# Patient Record
Sex: Male | Born: 2000 | Race: Black or African American | Hispanic: No | Marital: Single | Smoking: Current some day smoker
Health system: Southern US, Community
[De-identification: ages and names within clinical notes are randomized; demographics above are authoritative.]

## PROBLEM LIST (undated history)

## (undated) HISTORY — PX: WISDOM TOOTH EXTRACTION: SHX21

---
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
## 2020-01-25 ENCOUNTER — Ambulatory Visit (INDEPENDENT_AMBULATORY_CARE_PROVIDER_SITE_OTHER): Payer: Medicaid Other

## 2020-01-25 ENCOUNTER — Ambulatory Visit (HOSPITAL_COMMUNITY)
Admission: EM | Admit: 2020-01-25 | Discharge: 2020-01-25 | Disposition: A | Discharge status: Home / Self Care | Payer: Medicaid Other | Attending: Family Medicine | Admitting: Family Medicine

## 2020-01-25 ENCOUNTER — Other Ambulatory Visit: Payer: Self-pay

## 2020-01-25 ENCOUNTER — Encounter (HOSPITAL_COMMUNITY): Payer: Self-pay

## 2020-01-25 DIAGNOSIS — M79671 Pain in right foot: Secondary | ICD-10-CM | POA: Diagnosis not present

## 2020-01-25 MED ORDER — CEPHALEXIN 500 MG PO CAPS: 500.0000 mg | ORAL_CAPSULE | Freq: Two times a day (BID) | ORAL | 0 refills | Status: DC

## 2020-01-25 NOTE — Discharge Instructions (Addendum)
Please try ice  Please try to elevate  Please try the medicine  Please avoid walking on the foot  Please follow up with sports medicine in two days.

## 2020-01-25 NOTE — ED Provider Notes (Signed)
[facility]    CSN: [id number] Arrival date & time: 01/25/20  1805      History   Chief Complaint Chief Complaint  Patient presents with  . Foot Pain    HPI Caleb Decock is a 19 y.o. male.   He is presenting with right foot pain.  He is having swelling and pain over the dorsum and lateral aspect of his forefoot.  He had an inversion injury on Saturday while playing basketball.  He is unable to walk or ambulate without significant pain.  He is able to move his toes.  No history of similar pain or injury.  HPI  History reviewed. No pertinent past medical history.  There are no problems to display for this patient.   History reviewed. No pertinent surgical history.     Home Medications    Prior to Admission medications   Medication Sig Start Date End Date Taking? Authorizing Provider  cephALEXin (KEFLEX) 500 MG capsule Take 1 capsule (500 mg total) by mouth 2 (two) times daily. 01/25/20   Myra Rude, MD  lisdexamfetamine (VYVANSE) 20 MG capsule Take 20 mg by mouth every morning.  12/26/19  [provider]    Family History Family History  Problem Relation Age of Onset  . Healthy Mother   . Healthy Father     Social History Social History   Tobacco Use  . Smoking status: Current Some Day Smoker    Types: Cigarettes  . Smokeless tobacco: Never Used  Substance Use Topics  . Alcohol use: Never  . Drug use: Yes    Types: Marijuana     Allergies   Patient has no known allergies.   Review of Systems Review of Systems  See HPI  Physical Exam Triage Vital Signs ED Triage Vitals [01/25/20 1859]  Enc Vitals Group     BP 131/82     Pulse Rate 60     Resp 18     Temp 98.9 F (37.2 C)     Temp Source Oral     SpO2 95 %     Weight      Height      Head Circumference      Peak Flow      Pain Score 6     Pain Loc      Pain Edu?      Excl. in GC?    No data found.  Updated Vital Signs BP 131/82 (BP Location: Right Arm)    Pulse 60   Temp 98.9 F (37.2 C) (Oral)   Resp 18   SpO2 95%   Visual Acuity Right Eye Distance:   Left Eye Distance:   Bilateral Distance:    Right Eye Near:   Left Eye Near:    Bilateral Near:     Physical Exam Gen: NAD, alert, cooperative with exam, well-appearing ENT: normal lips, normal nasal mucosa,  Neuro: normal tone, normal sensation to touch Psych:  normal insight, alert and oriented MSK:  Right foot: Significant swelling over the dorsum midfoot and forefoot. Tenderness to palpation over the metatarsals as well as the tarsometatarsal joints of the lateral midfoot forefoot. Erythema and warmth in this area. Neurovascular intact   UC Treatments / Results  Labs (all labs ordered are listed, but only abnormal results are displayed) Labs Reviewed - No data to display  EKG   Radiology DG Foot Complete Right  Result Date: 01/25/2020 CLINICAL DATA:  Acute right foot pain after injury 2  days ago. EXAM: RIGHT FOOT COMPLETE - 3+ VIEW COMPARISON:  None. FINDINGS: There is no evidence of fracture or dislocation. There is no evidence of arthropathy or other focal bone abnormality. Soft tissues are unremarkable. IMPRESSION: Negative. Electronically Signed   By: Marijo Conception M.D.   On: 01/25/2020 20:06    Procedures Procedures (including critical care time)  Medications Ordered in UC Medications - No data to display  Initial Impression / Assessment and Plan / UC Course  I have reviewed the triage vital signs and the nursing notes.  Pertinent labs & imaging results that were available during my care of the patient were reviewed by me and considered in my medical decision making (see chart for details).     Mr. Orvis is a 19 year old male that is presenting with right foot pain.  Imaging was unrevealing for fracture.  Possible for ligamentous injury but has erythema and warmth over the foot.  Concern for infection so will provide Keflex.  Provided crutches and  counseled on nonweightbearing.  Counseled on close follow-up as to monitor symptoms and need for possible further imaging or work-up.  Final Clinical Impressions(s) / UC Diagnoses   Final diagnoses:  Foot pain, right     Discharge Instructions     Please try ice  Please try to elevate  Please try the medicine  Please avoid walking on the foot  Please follow up with sports medicine in two days.     ED Prescriptions    Medication Sig Dispense Auth. Provider   cephALEXin (KEFLEX) 500 MG capsule Take 1 capsule (500 mg total) by mouth 2 (two) times daily. 14 capsule Rosemarie Ax, MD     PDMP not reviewed this encounter.   Rosemarie Ax, MD 01/25/20 2117

## 2020-01-25 NOTE — ED Triage Notes (Signed)
Pt presents with right foot pain from injury while playing ball on Saturday.

## 2021-01-29 ENCOUNTER — Encounter (HOSPITAL_COMMUNITY): Payer: Self-pay | Admitting: Emergency Medicine

## 2021-01-29 ENCOUNTER — Emergency Department (HOSPITAL_COMMUNITY): Payer: Medicaid Other

## 2021-01-29 ENCOUNTER — Other Ambulatory Visit: Payer: Self-pay

## 2021-01-29 ENCOUNTER — Emergency Department (HOSPITAL_COMMUNITY)
Admission: EM | Admit: 2021-01-29 | Discharge: 2021-01-29 | Disposition: A | Discharge status: Home / Self Care | Payer: Medicaid Other | Attending: Emergency Medicine | Admitting: Emergency Medicine

## 2021-01-29 DIAGNOSIS — M25562 Pain in left knee: Secondary | ICD-10-CM | POA: Insufficient documentation

## 2021-01-29 DIAGNOSIS — X58XXXA Exposure to other specified factors, initial encounter: Secondary | ICD-10-CM | POA: Insufficient documentation

## 2021-01-29 DIAGNOSIS — Y9367 Activity, basketball: Secondary | ICD-10-CM | POA: Diagnosis not present

## 2021-01-29 DIAGNOSIS — F17210 Nicotine dependence, cigarettes, uncomplicated: Secondary | ICD-10-CM | POA: Insufficient documentation

## 2021-01-29 MED ORDER — HYDROCODONE-ACETAMINOPHEN 5-325 MG PO TABS
1.0000 | ORAL_TABLET | Freq: Once | ORAL | Status: AC
  Administered 2021-01-29: 1 via ORAL
  Filled 2021-01-29: qty 1

## 2021-01-29 MED ORDER — HYDROCODONE-ACETAMINOPHEN 5-325 MG PO TABS: 1.0000 | ORAL_TABLET | ORAL | 0 refills | Status: DC | PRN

## 2021-01-29 NOTE — Progress Notes (Signed)
Orthopedic Tech Progress Note Patient Details:  Nathaniel Matthews [birth year] [id number]  Ortho Devices Type of Ortho Device: Knee Immobilizer Ortho Device/Splint Location: Left Lower Extremity Ortho Device/Splint Interventions: Ordered,Application,Adjustment   Post Interventions Patient Tolerated: Well Instructions Provided: Adjustment of device,Care of device,Poper ambulation with device   [name] P Harle Stanford 01/29/2021, 1:36 PM

## 2021-01-29 NOTE — Discharge Instructions (Signed)
Based on your history and exam I am concerned he may have torn some of the ligaments in your knee or injured the soft tissues around your knee.  There is no evidence of bony injury seen on the x-ray today.  Please use the knee immobilizer and your crutches and use some of the pain medicine to help with symptoms.  Please follow-up with an orthopedist for likely outpatient imaging.  If any symptoms change or worsen, please return to the nearest emergency department.

## 2021-01-29 NOTE — ED Triage Notes (Signed)
C/o L knee pain.  States he injured it playing basketball yesterday.  Pt wearing knee brace on arrival.

## 2021-01-29 NOTE — ED Notes (Signed)
Ice bag placed

## 2021-01-29 NOTE — ED Provider Notes (Signed)
[facility] EMERGENCY DEPARTMENT Provider Note   CSN: [id number] Arrival date & time: 01/29/21  1136     History Chief Complaint  Patient presents with  . Knee Pain    Nathaniel Matthews is a 20 y.o. male.  The history is provided by the patient and medical records. No language interpreter was used.  Knee Pain Location:  Knee Time since incident:  1 day Injury: yes   Mechanism of injury comment:  Pivot playing basketball Knee location:  L knee Pain details:    Quality:  Sharp   Radiates to:  Does not radiate   Severity:  Severe   Onset quality:  Sudden   Timing:  Constant   Progression:  Unchanged Chronicity:  New Prior injury to area:  No Relieved by:  Nothing Worsened by:  Bearing weight Ineffective treatments:  None tried Associated symptoms: swelling   Associated symptoms: no back pain, no fatigue, no fever, no neck pain, no numbness, no stiffness and no tingling        History reviewed. No pertinent past medical history.  There are no problems to display for this patient.   History reviewed. No pertinent surgical history.     Family History  Problem Relation Age of Onset  . Healthy Mother   . Healthy Father     Social History   Tobacco Use  . Smoking status: Current Some Day Smoker    Types: Cigarettes  . Smokeless tobacco: Never Used  Vaping Use  . Vaping Use: Never used  Substance Use Topics  . Alcohol use: Never  . Drug use: Yes    Types: Marijuana    Home Medications Prior to Admission medications   Medication Sig Start Date End Date Taking? Authorizing Provider  cephALEXin (KEFLEX) 500 MG capsule Take 1 capsule (500 mg total) by mouth 2 (two) times daily. 01/25/20   Myra Rude, MD  lisdexamfetamine (VYVANSE) 20 MG capsule Take 20 mg by mouth every morning.  12/26/19  [provider]    Allergies    Patient has no known allergies.  Review of Systems   Review of Systems  Constitutional: Negative for  chills, fatigue and fever.  HENT: Negative for congestion.   Eyes: Negative for visual disturbance.  Respiratory: Negative for cough, chest tightness, shortness of breath and wheezing.   Cardiovascular: Negative for chest pain.  Gastrointestinal: Negative for constipation and nausea.  Genitourinary: Negative for dysuria and flank pain.  Musculoskeletal: Negative for back pain, neck pain and stiffness.  Neurological: Negative for dizziness, light-headedness and headaches.  All other systems reviewed and are negative.   Physical Exam Updated Vital Signs BP 119/73 (BP Location: Right Arm)   Pulse (!) 110   Temp 98.2 F (36.8 C) (Oral)   Resp 18   SpO2 94%   Physical Exam Vitals and nursing note reviewed.  Constitutional:      Appearance: He is well-developed.  HENT:     Head: Normocephalic and atraumatic.  Eyes:     Conjunctiva/sclera: Conjunctivae normal.  Cardiovascular:     Rate and Rhythm: Normal rate and regular rhythm.     Heart sounds: No murmur heard.   Pulmonary:     Effort: Pulmonary effort is normal. No respiratory distress.     Breath sounds: Normal breath sounds.  Abdominal:     Palpations: Abdomen is soft.     Tenderness: There is no abdominal tenderness.  Musculoskeletal:        General: Swelling  and tenderness present.     Cervical back: Neck supple.     Left knee: Tenderness present.     Right lower leg: No edema.     Left lower leg: No edema.       Legs:     Comments: Intact, strength, sensation, and pulses distally to the injury.  Some swelling and tenderness of the left knee.  Skin:    General: Skin is warm and dry.     Findings: No erythema.  Neurological:     General: No focal deficit present.     Mental Status: He is alert.     Sensory: No sensory deficit.     Motor: No weakness.     ED Results / Procedures / Treatments   Labs (all labs ordered are listed, but only abnormal results are displayed) Labs Reviewed - No data to  display  EKG None  Radiology DG Knee Complete 4 Views Left  Result Date: 01/29/2021 CLINICAL DATA:  20 year old male with history of generalized left knee pain. EXAM: LEFT KNEE - COMPLETE 4+ VIEW COMPARISON:  No priors. FINDINGS: No evidence of fracture, dislocation, or joint effusion. No evidence of arthropathy or other focal bone abnormality. Soft tissues are unremarkable. IMPRESSION: Negative. Electronically Signed   By: Trudie Reed M.D.   On: 01/29/2021 12:37    Procedures Procedures   Medications Ordered in ED Medications  HYDROcodone-acetaminophen (NORCO/VICODIN) 5-325 MG per tablet 1 tablet (1 tablet Oral Given 01/29/21 1325)    ED Course  I have reviewed the triage vital signs and the nursing notes.  Pertinent labs & imaging results that were available during my care of the patient were reviewed by me and considered in my medical decision making (see chart for details).    MDM Rules/Calculators/A&P                          [name] is a 20 y.o. male with no significant past medical history presents with left knee injury.  He reports that yesterday he was pain basketball when he pivoted and felt sudden onset of pain.  He reports it has been swollen and hurting and he has not been able to walk on it very well.  He is using crutches and a knee immobilizer but was told him to come in to look for fracture.  He denied falling on it or getting struck during his basketball play.  He denies any numbness, tingling, or weakness otherwise at discharge to stand or walk on it or bend.  Denies any other complaints including no preceding symptom such as fevers, chills, nausea, vomiting, or other complaints.  On exam, patient does have tenderness and swelling of the left knee but did not have any tenderness in the ankle or hip.  It is slightly swollen and tender but there was no evidence of erythema.  No crepitance.  He could range his knee with pain.  Intact pulses distally.  Intact  sensation and strength distal to the injury.  Clinically I am concerned the patient has either a ligamentous injury or meniscus injury primarily.  X-rays were obtained showing no evidence of fracture or dislocation.  Patient was given a more appropriately sized knee immobilizer and will use his home crutches.  He will follow-up with outpatient orthopedics for likely more advanced imaging to look for ligamentous injury.  Patient given prescription for pain medicine and was given a dose here due to the amount of  discomfort he is having.  Given lack of other injuries, we feel he is safe for discharge home.  Patient was given return precautions of all instructions and was discharged in good condition.             Final Clinical Impression(s) / ED Diagnoses Final diagnoses:  Acute pain of left knee  Injury while playing basketball    Rx / DC Orders ED Discharge Orders         Ordered    HYDROcodone-acetaminophen (NORCO/VICODIN) 5-325 MG tablet  Every 4 hours PRN        01/29/21 1337          Clinical Impression: 1. Acute pain of left knee   2. Injury while playing basketball     Disposition: Discharge  Condition: Good  I have discussed the results, Dx and Tx plan with the pt(& family if present). He/she/they expressed understanding and agree(s) with the plan. Discharge instructions discussed at great length. Strict return precautions discussed and pt &/or family have verbalized understanding of the instructions. No further questions at time of discharge.    New Prescriptions   HYDROCODONE-ACETAMINOPHEN (NORCO/VICODIN) 5-325 MG TABLET    Take 1 tablet by mouth every 4 (four) hours as needed.    Follow Up: Roby Lofts, MD [address] [place] [address] [phone]   with orthopedics  [facility] EMERGENCY DEPARTMENT [address] [id number] mc [place] [address] [phone]       Shakti Fleer, Canary Brim,  MD 01/29/21 1348

## 2021-02-01 ENCOUNTER — Other Ambulatory Visit: Payer: Self-pay

## 2021-02-01 ENCOUNTER — Ambulatory Visit (INDEPENDENT_AMBULATORY_CARE_PROVIDER_SITE_OTHER): Payer: Medicaid Other | Admitting: Family

## 2021-02-01 ENCOUNTER — Encounter: Payer: Self-pay | Admitting: Family

## 2021-02-01 DIAGNOSIS — S8992XA Unspecified injury of left lower leg, initial encounter: Secondary | ICD-10-CM

## 2021-02-01 DIAGNOSIS — M25362 Other instability, left knee: Secondary | ICD-10-CM | POA: Diagnosis not present

## 2021-02-01 NOTE — Progress Notes (Signed)
Office Visit Note   Patient: Nathaniel Matthews           Date of Birth: [birth year]           MRN: [id number] Visit Date: 02/01/2021              Requested by: Bernadette Hoit, MD [facility] [address] [place],  [place] [address] PCP: Bernadette Hoit, MD  No chief complaint on file.     HPI: The patient is a 20 year old male seen today for initial evaluation of left knee pain after a pivot/twist injury playing basketball.  Complaining of pain global swelling instability of the knee unable to weight-bear due to pain and buckling of the knee.  He has been seen by urgent care as well as an emergency department for the same.  He is currently using crutches and a knee brace for nonweightbearing.  Assessment & Plan: Visit Diagnoses:  1. Knee instability, left   2. Left knee injury, initial encounter     Plan: plan to proceed with MRI, concern for ligamentous injury. Continue crutches for support. Will follow up for mri review with dean.   Follow-Up Instructions: Return mri review with dean.   Left Knee Exam   Tenderness  The patient is experiencing tenderness in the LCL and MCL.  Range of Motion  The patient has normal left knee ROM.  Other  Effusion: effusion present  Comments:  Medial and lateral laxity with firm endpoint  Difficulty with drawer testing due to pain, patient resists      Patient is alert, oriented, no adenopathy, well-dressed, normal affect, normal respiratory effort.   Imaging: No results found. No images are attached to the encounter.  Labs: Lab Results  Component Value Date   REPTSTATUS 11/24/2019 FINAL 11/22/2019   CULT  11/22/2019    NO GROUP A STREP (S.PYOGENES) ISOLATED Performed at [facility], [address], [place], [place] [address]      No results found for: ALBUMIN, PREALBUMIN, CBC  No results found for: MG No results found for: VD25OH  No results found for: PREALBUMIN No  flowsheet data found.   There is no height or weight on file to calculate BMI.  Orders:  Orders Placed This Encounter  Procedures  . MR Knee Left w/o contrast   Meds ordered this encounter  Medications  . HYDROcodone-acetaminophen (NORCO) 5-325 MG tablet    Sig: Take 1 tablet by mouth every 6 (six) hours as needed.    Dispense:  30 tablet    Refill:  0     Procedures: No procedures performed  Clinical Data: No additional findings.  ROS:  All other systems negative, except as noted in the HPI. Review of Systems  Constitutional: Negative for chills and fever.  Musculoskeletal: Positive for arthralgias, gait problem, joint swelling and myalgias.  Neurological: Negative for weakness.    Objective: Vital Signs: There were no vitals taken for this visit.  Specialty Comments:  No specialty comments available.  PMFS History: There are no problems to display for this patient.  History reviewed. No pertinent past medical history.  Family History  Problem Relation Age of Onset  . Healthy Mother   . Healthy Father     History reviewed. No pertinent surgical history. Social History   Occupational History  . Not on file  Tobacco Use  . Smoking status: Current Some Day Smoker    Types: Cigarettes  . Smokeless tobacco: Never Used  Vaping Use  . Vaping Use: Never used  Substance and Sexual Activity  . Alcohol use: Never  . Drug use: Yes    Types: Marijuana  . Sexual activity: Not Currently

## 2021-02-02 ENCOUNTER — Telehealth: Payer: Self-pay | Admitting: Family

## 2021-02-02 NOTE — Telephone Encounter (Signed)
Mom called. Says the pain medication was not sent in for the patient. Would like medication called in for him. Her call back number is [phone]

## 2021-02-02 NOTE — Telephone Encounter (Signed)
This pt was in the office Wednesday did you say you were going to call in rx for pain?

## 2021-02-03 MED ORDER — HYDROCODONE-ACETAMINOPHEN 5-325 MG PO TABS: 1.0000 | ORAL_TABLET | Freq: Four times a day (QID) | ORAL | 0 refills | Status: DC | PRN

## 2021-02-10 ENCOUNTER — Telehealth: Payer: Self-pay | Admitting: Orthopedic Surgery

## 2021-02-10 NOTE — Telephone Encounter (Signed)
Appt made. MRI is scheduled for Sat.

## 2021-02-10 NOTE — Telephone Encounter (Signed)
Pt mother called and wants to know if Dr.Dean can work pt in this week or if Franky Macho could possibly see him because he is in a lot of pain   CB [phone]

## 2021-02-11 ENCOUNTER — Other Ambulatory Visit: Payer: Self-pay

## 2021-02-11 ENCOUNTER — Ambulatory Visit
Admission: RE | Admit: 2021-02-11 | Discharge: 2021-02-11 | Disposition: A | Discharge status: Home / Self Care | Payer: Medicaid Other | Source: Ambulatory Visit | Attending: Family | Admitting: Family

## 2021-02-11 DIAGNOSIS — M25362 Other instability, left knee: Secondary | ICD-10-CM

## 2021-02-13 ENCOUNTER — Ambulatory Visit (INDEPENDENT_AMBULATORY_CARE_PROVIDER_SITE_OTHER): Payer: Medicaid Other | Admitting: Orthopedic Surgery

## 2021-02-13 ENCOUNTER — Encounter: Payer: Self-pay | Admitting: Orthopedic Surgery

## 2021-02-13 ENCOUNTER — Other Ambulatory Visit: Payer: Self-pay

## 2021-02-13 ENCOUNTER — Ambulatory Visit (HOSPITAL_COMMUNITY)
Admission: RE | Admit: 2021-02-13 | Discharge: 2021-02-13 | Disposition: A | Discharge status: Home / Self Care | Payer: Medicaid Other | Source: Ambulatory Visit | Attending: Orthopedic Surgery | Admitting: Orthopedic Surgery

## 2021-02-13 DIAGNOSIS — M25362 Other instability, left knee: Secondary | ICD-10-CM

## 2021-02-15 ENCOUNTER — Other Ambulatory Visit: Payer: Self-pay

## 2021-02-16 ENCOUNTER — Encounter: Payer: Self-pay | Admitting: Orthopedic Surgery

## 2021-02-16 NOTE — Progress Notes (Signed)
Office Visit Note   Patient: Nathaniel Matthews           Date of Birth: 2000-09-06           MRN: [id number] Visit Date: 02/13/2021 Requested by: Bernadette Hoit, MD [facility] [address] [place],  [place] [address] PCP: Bernadette Hoit, MD  Subjective: Chief Complaint  Patient presents with   Left Knee - Pain    HPI: Nathaniel Matthews is a 20 year old patient who injured his left knee playing basketball on May 28.  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  He has been on crutches.  He has improved some over the past several days.  MRI scan is reviewed.  ACL is torn.  There is medial meniscal tear with horizontal and possible meniscocapsular injury.  There is also significant lateral meniscal pathology with avulsed meniscal tissue flipped anteriorly.  Very little if any meniscal tissue is present in the posterior aspect of the knee on the lateral side.  There is also proximal avulsion of the MCL.  No definite personal history of DVT but the patient is adopted so that part of his history is unknown.              ROS: All systems reviewed are negative as they relate to the chief complaint within the history of present illness.  Patient denies  fevers or chills.   Assessment & Plan: Visit Diagnoses:  1. Knee instability, left     Plan: Impression is significant multi ligament knee injury with meniscal pathology on both sides.  Pedal pulses palpable with ankle dorsiflexion intact.  Does have mild calf swelling and tenderness today but ultrasound performed by the time of this dictation was negative for DVT.  Plan is to continue with range of motion exercises and limited weightbearing with crutches.  Needs to try to achieve full extension prior to surgery but that may be difficult with that flipped meniscal tissue present.  Acute inflammatory phase has passed in the 2 weeks since injury.  Plan ACL reconstruction with quad autograft and meniscal repair  medially and laterally.  We will assess the MCL at the time of surgery.  Is possible that based on the amount of laxity present we may not need to fix the MCL.  The risk and benefits of the procedure are discussed with the patient including not limited to infection nerve vessel damage knee stiffness as well as the prolonged rehabilitation required with this injury.  Particularly with that lateral meniscal pathology present.  If the lateral meniscus is repaired it would likely require a meniscal root type repair.  Patient understands the risk and benefits and wishes to proceed.  All questions answered.  Follow-Up Instructions: No follow-ups on file.   Orders:  Orders Placed This Encounter  Procedures   VAS [place] LOWER EXTREMITY VENOUS (DVT)   No orders of the defined types were placed in this encounter.     Procedures: No procedures performed   Clinical Data: No additional findings.  Objective: Vital Signs: There were no vitals taken for this visit.  Physical Exam:   Constitutional: Patient appears well-developed HEENT:  Head: Normocephalic Eyes:EOM are normal Neck: Normal range of motion Cardiovascular: Normal rate Pulmonary/chest: Effort normal Neurologic: Patient is alert Skin: Skin is warm Psychiatric: Patient has normal mood and affect   Ortho Exam: Ortho exam demonstrates range of motion of just below 10 degrees full extension to 115 of flexion.  Does have  some MCL laxity to valgus stress at both 0 and 30 degrees.  Opens up about 2 mm more at 0 degrees compared to the right and about 4 mm more at 30 degrees compared to the right-hand side with valgus stress.  No posterior rotatory instability is present.  ACL laxity is present.  Extensor mechanism is intact.  Pedal pulses palpable.  Ankle dorsiflexion intact.  Mild calf tenderness to direct palpation and with Homans' sign.  Specialty Comments:  No specialty comments available.  Imaging: No results found.   PMFS  History: There are no problems to display for this patient.  History reviewed. No pertinent past medical history.  Family History  Problem Relation Age of Onset   Healthy Mother    Healthy Father     History reviewed. No pertinent surgical history. Social History   Occupational History   Not on file  Tobacco Use   Smoking status: Some Days    Pack years: 0.00    Types: Cigarettes   Smokeless tobacco: Never  Vaping Use   Vaping Use: Never used  Substance and Sexual Activity   Alcohol use: Never   Drug use: Yes    Types: Marijuana   Sexual activity: Not Currently

## 2021-02-17 NOTE — Pre-Procedure Instructions (Signed)
Surgical Instructions:    Your procedure is scheduled on Thursday, June 23rd.  Report to [facility] Main Entrance "A" at 1:50 PM., then check in with the Admitting office.  Call this number if you have problems the morning of surgery:  [phone]     Remember:  Do not eat after midnight the night before your surgery.  You may drink clear liquids until 12:50 PM the afternoon of your surgery.   Clear liquids allowed are: Water, Non-Citrus Juices (without pulp), Carbonated Beverages, Clear Tea, Black Coffee Only, and Gatorade.   Enhanced Recovery after Surgery for Orthopedics Enhanced Recovery after Surgery is a protocol used to improve the stress on your body and your recovery after surgery.  Patient Instructions  The day of surgery (if you do NOT have diabetes):  Drink ONE (1) Pre-Surgery Clear Ensure by 12:50 PM the afternoon of surgery.   This drink was given to you during your hospital pre-op appointment visit. Nothing else to drink after completing the Pre-Surgery Clear Ensure.         If you have questions, please contact your surgeon's office.     Take these medicines the morning of surgery with A SIP OF WATER: acetaminophen (TYLENOL)- if needed HYDROcodone-acetaminophen [facility]) - if needed    As of today, STOP taking any Aspirin (unless otherwise instructed by your surgeon) Aleve, Naproxen, Ibuprofen, Motrin, Advil, Goody's, BC's, all herbal medications, fish oil, and all vitamins.    Special instructions:    [place]- Preparing For Surgery  Before surgery, you can play an important role. Because skin is not sterile, your skin needs to be as free of germs as possible. You can reduce the number of germs on your skin by washing with CHG (chlorahexidine gluconate) Soap before surgery.  CHG is an antiseptic cleaner which kills germs and bonds with the skin to continue killing germs even after washing.     Please do not use if you have an allergy to CHG or  antibacterial soaps. If your skin becomes reddened/irritated stop using the CHG.  Do not shave (including legs and underarms) for at least 48 hours prior to first CHG shower. It is OK to shave your face.  Please follow these instructions carefully.     Shower the NIGHT BEFORE SURGERY and the MORNING OF SURGERY with CHG Soap.   If you chose to wash your hair, wash your hair first as usual with your normal shampoo. After you shampoo, rinse your hair and body thoroughly to remove the shampoo.  Then [employer] and genitals (private parts) with your normal soap and rinse thoroughly to remove soap.  After that Use CHG Soap as you would any other liquid soap. You can apply CHG directly to the skin and wash gently with a scrungie or a clean washcloth.   Apply the CHG Soap to your body ONLY FROM THE NECK DOWN.  Do not use on open wounds or open sores. Avoid contact with your eyes, ears, mouth and genitals (private parts). Wash Face and genitals (private parts)  with your normal soap.   Wash thoroughly, paying special attention to the area where your surgery will be performed.  Thoroughly rinse your body with warm water from the neck down.  DO NOT shower/wash with your normal soap after using and rinsing off the CHG Soap.  Pat yourself dry with a CLEAN TOWEL.  Wear CLEAN PAJAMAS to bed the night before surgery  Place CLEAN SHEETS on your bed the night  before your surgery  DO NOT SLEEP WITH PETS.    Day of Surgery: Take a shower with CHG soap. Wear Clean/Comfortable clothing the morning of surgery Do not apply any deodorants/lotions.   Remember to brush your teeth WITH YOUR REGULAR TOOTHPASTE. Do not wear jewelry. Men may shave face and neck. Do not bring valuables to the hospital. [facility] is not responsible for any belongings or valuables.   Do NOT Smoke (Tobacco/Vaping) or drink Alcohol 24 hours prior to your procedure. If you use a CPAP at night, you may bring all equipment for  your overnight stay.   Contacts, glasses, dentures or bridgework may not be worn into surgery, please bring cases for these belongings   For patients admitted to the hospital, discharge time will be determined by your treatment team.   Patients discharged the day of surgery will not be allowed to drive home, and someone needs to stay with them for 24 hours.  ONLY 1 SUPPORT PERSON MAY BE PRESENT WHILE YOU ARE IN SURGERY. IF YOU ARE TO BE ADMITTED ONCE YOU ARE IN YOUR ROOM YOU WILL BE ALLOWED TWO (2) VISITORS.  Minor children may have two parents present. Special consideration for safety and communication needs will be reviewed on a case by case basis.   Please read over the following fact sheets that you were given.

## 2021-02-20 ENCOUNTER — Encounter (HOSPITAL_COMMUNITY): Payer: Self-pay

## 2021-02-20 ENCOUNTER — Other Ambulatory Visit (HOSPITAL_COMMUNITY)
Admission: RE | Admit: 2021-02-20 | Discharge: 2021-02-20 | Disposition: A | Discharge status: Home / Self Care | Payer: Medicaid Other | Source: Ambulatory Visit | Attending: Orthopedic Surgery | Admitting: Orthopedic Surgery

## 2021-02-20 ENCOUNTER — Other Ambulatory Visit: Payer: Self-pay

## 2021-02-20 ENCOUNTER — Encounter (HOSPITAL_COMMUNITY)
Admission: RE | Admit: 2021-02-20 | Discharge: 2021-02-20 | Disposition: A | Discharge status: Home / Self Care | Payer: Medicaid Other | Source: Ambulatory Visit | Attending: Orthopedic Surgery | Admitting: Orthopedic Surgery

## 2021-02-20 DIAGNOSIS — Z01812 Encounter for preprocedural laboratory examination: Secondary | ICD-10-CM | POA: Insufficient documentation

## 2021-02-20 DIAGNOSIS — Z20822 Contact with and (suspected) exposure to covid-19: Secondary | ICD-10-CM | POA: Insufficient documentation

## 2021-02-20 LAB — CBC
HCT: 46.3 % (ref 39.0–52.0)
Hemoglobin: 15.6 g/dL (ref 13.0–17.0)
MCH: 30.4 pg (ref 26.0–34.0)
MCHC: 33.7 g/dL (ref 30.0–36.0)
MCV: 90.3 fL (ref 80.0–100.0)
Platelets: 354 10*3/uL (ref 150–400)
RBC: 5.13 MIL/uL (ref 4.22–5.81)
RDW: 12.1 % (ref 11.5–15.5)
WBC: 7.5 10*3/uL (ref 4.0–10.5)
nRBC: 0 % (ref 0.0–0.2)

## 2021-02-20 LAB — BASIC METABOLIC PANEL
Anion gap: 7 (ref 5–15)
BUN: 11 mg/dL (ref 6–20)
CO2: 28 mmol/L (ref 22–32)
Calcium: 9.6 mg/dL (ref 8.9–10.3)
Chloride: 103 mmol/L (ref 98–111)
Creatinine, Ser: 1.02 mg/dL (ref 0.61–1.24)
GFR, Estimated: >60 mL/min (ref >60)
Glucose, Bld: 83 mg/dL (ref 70–99)
Potassium: 4 mmol/L (ref 3.5–5.1)
Sodium: 138 mmol/L (ref 135–145)

## 2021-02-20 LAB — SARS CORONAVIRUS 2 (TAT 6-24 HRS): SARS Coronavirus 2: NEGATIVE

## 2021-02-20 NOTE — Progress Notes (Signed)
PCP - Dr. Bernadette Hoit Cardiologist - denies  Chest x-ray - n/a EKG - n/a Stress Test - denies ECHO - denies Cardiac Cath - denies  Sleep Study - denies CPAP - denies  Blood Thinner Instructions: n/a Aspirin Instructions: n/a  ERAS Protcol - Clear liquids up until 1250 DOS PRE-SURGERY Ensure or G2- Pt given (1) pre-surgical ensure.   COVID TEST- 02/20/21; pending.  Anesthesia review: No  Patient denies shortness of breath, fever, cough and chest pain at PAT appointment   All instructions explained to the patient, with a verbal understanding of the material. Patient agrees to go over the instructions while at home for a better understanding. Patient also instructed to self quarantine after being tested for COVID-19. The opportunity to ask questions was provided.

## 2021-02-23 ENCOUNTER — Ambulatory Visit (HOSPITAL_COMMUNITY): Payer: Medicaid Other | Admitting: Anesthesiology

## 2021-02-23 ENCOUNTER — Ambulatory Visit (HOSPITAL_COMMUNITY): Payer: Medicaid Other

## 2021-02-23 ENCOUNTER — Other Ambulatory Visit: Payer: Self-pay

## 2021-02-23 ENCOUNTER — Encounter (HOSPITAL_COMMUNITY)
Admission: RE | Disposition: A | Discharge status: Home / Self Care | Payer: Self-pay | Source: Home / Self Care | Attending: Orthopedic Surgery

## 2021-02-23 ENCOUNTER — Ambulatory Visit (HOSPITAL_COMMUNITY)
Admission: RE | Admit: 2021-02-23 | Discharge: 2021-02-23 | Disposition: A | Discharge status: Home / Self Care | Payer: Medicaid Other | Attending: Orthopedic Surgery | Admitting: Orthopedic Surgery

## 2021-02-23 ENCOUNTER — Encounter (HOSPITAL_COMMUNITY): Payer: Self-pay | Admitting: Orthopedic Surgery

## 2021-02-23 DIAGNOSIS — S83282A Other tear of lateral meniscus, current injury, left knee, initial encounter: Secondary | ICD-10-CM

## 2021-02-23 DIAGNOSIS — F17290 Nicotine dependence, other tobacco product, uncomplicated: Secondary | ICD-10-CM | POA: Insufficient documentation

## 2021-02-23 DIAGNOSIS — E669 Obesity, unspecified: Secondary | ICD-10-CM | POA: Insufficient documentation

## 2021-02-23 DIAGNOSIS — S83412D Sprain of medial collateral ligament of left knee, subsequent encounter: Secondary | ICD-10-CM

## 2021-02-23 DIAGNOSIS — S83412A Sprain of medial collateral ligament of left knee, initial encounter: Secondary | ICD-10-CM

## 2021-02-23 DIAGNOSIS — S83242A Other tear of medial meniscus, current injury, left knee, initial encounter: Secondary | ICD-10-CM | POA: Diagnosis not present

## 2021-02-23 DIAGNOSIS — Z6831 Body mass index (BMI) 31.0-31.9, adult: Secondary | ICD-10-CM | POA: Diagnosis not present

## 2021-02-23 DIAGNOSIS — S83282D Other tear of lateral meniscus, current injury, left knee, subsequent encounter: Secondary | ICD-10-CM

## 2021-02-23 DIAGNOSIS — S83252A Bucket-handle tear of lateral meniscus, current injury, left knee, initial encounter: Secondary | ICD-10-CM | POA: Insufficient documentation

## 2021-02-23 DIAGNOSIS — S83512D Sprain of anterior cruciate ligament of left knee, subsequent encounter: Secondary | ICD-10-CM

## 2021-02-23 DIAGNOSIS — Y9367 Activity, basketball: Secondary | ICD-10-CM | POA: Diagnosis not present

## 2021-02-23 DIAGNOSIS — S83512A Sprain of anterior cruciate ligament of left knee, initial encounter: Secondary | ICD-10-CM

## 2021-02-23 HISTORY — PX: ANTERIOR CRUCIATE LIGAMENT REPAIR: SHX115

## 2021-02-23 SURGERY — RECONSTRUCTION, KNEE, ACL
Anesthesia: General | Site: Knee | Laterality: Left

## 2021-02-23 MED ORDER — MORPHINE SULFATE (PF) 4 MG/ML IV SOLN
INTRAVENOUS | Status: DC | PRN
  Administered 2021-02-23: 8 mg via SUBCUTANEOUS

## 2021-02-23 MED ORDER — CLONIDINE HCL (ANALGESIA) 100 MCG/ML EP SOLN
EPIDURAL | Status: DC | PRN
  Administered 2021-02-23: 1 mL via INTRA_ARTICULAR

## 2021-02-23 MED ORDER — LIDOCAINE 2% (20 MG/ML) 5 ML SYRINGE
INTRAMUSCULAR | Status: AC
  Filled 2021-02-23: qty 15

## 2021-02-23 MED ORDER — EPHEDRINE 5 MG/ML INJ
INTRAVENOUS | Status: AC
  Filled 2021-02-23: qty 10

## 2021-02-23 MED ORDER — ROCURONIUM BROMIDE 10 MG/ML (PF) SYRINGE
PREFILLED_SYRINGE | INTRAVENOUS | Status: DC | PRN
  Administered 2021-02-23 (×2): 20 mg via INTRAVENOUS
  Administered 2021-02-23: 60 mg via INTRAVENOUS
  Administered 2021-02-23: 20 mg via INTRAVENOUS
  Administered 2021-02-23: 1 mg via INTRAVENOUS

## 2021-02-23 MED ORDER — DEXAMETHASONE SODIUM PHOSPHATE 10 MG/ML IJ SOLN
INTRAMUSCULAR | Status: AC
  Filled 2021-02-23: qty 2

## 2021-02-23 MED ORDER — ONDANSETRON HCL 4 MG/2ML IJ SOLN
INTRAMUSCULAR | Status: DC | PRN
  Administered 2021-02-23: 4 mg via INTRAVENOUS

## 2021-02-23 MED ORDER — OXYCODONE HCL 5 MG/5ML PO SOLN: 5.0000 mg | Freq: Once | ORAL | Status: DC | PRN

## 2021-02-23 MED ORDER — FENTANYL CITRATE (PF) 100 MCG/2ML IJ SOLN
INTRAMUSCULAR | Status: AC
  Administered 2021-02-23: 100 ug
  Filled 2021-02-23: qty 2

## 2021-02-23 MED ORDER — DEXAMETHASONE SODIUM PHOSPHATE 10 MG/ML IJ SOLN
INTRAMUSCULAR | Status: DC | PRN
  Administered 2021-02-23: 10 mg via INTRAVENOUS

## 2021-02-23 MED ORDER — SUGAMMADEX SODIUM 200 MG/2ML IV SOLN
INTRAVENOUS | Status: DC | PRN
  Administered 2021-02-23: 250 mg via INTRAVENOUS

## 2021-02-23 MED ORDER — CHLORHEXIDINE GLUCONATE 0.12 % MT SOLN
OROMUCOSAL | Status: AC
  Administered 2021-02-23: 15 mL
  Filled 2021-02-23: qty 15

## 2021-02-23 MED ORDER — CEPHALEXIN 500 MG PO CAPS: 1000.0000 mg | ORAL_CAPSULE | Freq: Two times a day (BID) | ORAL | 0 refills | Status: AC

## 2021-02-23 MED ORDER — MIDAZOLAM HCL 2 MG/2ML IJ SOLN
INTRAMUSCULAR | Status: AC
  Filled 2021-02-23: qty 2

## 2021-02-23 MED ORDER — MORPHINE SULFATE (PF) 4 MG/ML IV SOLN
INTRAVENOUS | Status: AC
  Filled 2021-02-23: qty 1

## 2021-02-23 MED ORDER — HYDROMORPHONE HCL 1 MG/ML IJ SOLN
0.2500 mg | INTRAMUSCULAR | Status: DC | PRN
  Administered 2021-02-23: 0.5 mg via INTRAVENOUS

## 2021-02-23 MED ORDER — CEFAZOLIN SODIUM-DEXTROSE 2-4 GM/100ML-% IV SOLN
INTRAVENOUS | Status: AC
  Filled 2021-02-23: qty 100

## 2021-02-23 MED ORDER — ASPIRIN 81 MG PO CHEW: 81.0000 mg | CHEWABLE_TABLET | Freq: Every day | ORAL | 0 refills | Status: AC

## 2021-02-23 MED ORDER — FENTANYL CITRATE (PF) 250 MCG/5ML IJ SOLN
INTRAMUSCULAR | Status: AC
  Filled 2021-02-23: qty 5

## 2021-02-23 MED ORDER — OXYCODONE HCL 5 MG PO TABS: 5.0000 mg | ORAL_TABLET | Freq: Once | ORAL | Status: DC | PRN

## 2021-02-23 MED ORDER — LACTATED RINGERS IV SOLN: INTRAVENOUS | Status: DC

## 2021-02-23 MED ORDER — METHOCARBAMOL 500 MG PO TABS: 500.0000 mg | ORAL_TABLET | Freq: Three times a day (TID) | ORAL | 0 refills | Status: AC | PRN

## 2021-02-23 MED ORDER — CLONIDINE HCL (ANALGESIA) 100 MCG/ML EP SOLN
EPIDURAL | Status: AC
  Filled 2021-02-23: qty 10

## 2021-02-23 MED ORDER — FENTANYL CITRATE (PF) 250 MCG/5ML IJ SOLN
INTRAMUSCULAR | Status: DC | PRN
  Administered 2021-02-23: 100 ug via INTRAVENOUS
  Administered 2021-02-23: 150 ug via INTRAVENOUS
  Administered 2021-02-23: 100 ug via INTRAVENOUS
  Administered 2021-02-23: 50 ug via INTRAVENOUS

## 2021-02-23 MED ORDER — LIDOCAINE 2% (20 MG/ML) 5 ML SYRINGE
INTRAMUSCULAR | Status: DC | PRN
  Administered 2021-02-23: 100 mg via INTRAVENOUS

## 2021-02-23 MED ORDER — ROCURONIUM BROMIDE 10 MG/ML (PF) SYRINGE
PREFILLED_SYRINGE | INTRAVENOUS | Status: AC
  Filled 2021-02-23: qty 20

## 2021-02-23 MED ORDER — BUPIVACAINE-EPINEPHRINE (PF) 0.25% -1:200000 IJ SOLN
INTRAMUSCULAR | Status: DC | PRN
  Administered 2021-02-23: 10 mL

## 2021-02-23 MED ORDER — SODIUM CHLORIDE 0.9 % IR SOLN
Status: DC | PRN
  Administered 2021-02-23: 6000 mL
  Administered 2021-02-23: 12000 mL
  Administered 2021-02-23: 6000 mL

## 2021-02-23 MED ORDER — MIDAZOLAM HCL 2 MG/2ML IJ SOLN
0.5000 mg | Freq: Once | INTRAMUSCULAR | Status: DC | PRN
Start: 2021-02-23 — End: 2021-02-24

## 2021-02-23 MED ORDER — OXYCODONE-ACETAMINOPHEN 5-325 MG PO TABS: 1.0000 | ORAL_TABLET | ORAL | 0 refills | Status: AC | PRN

## 2021-02-23 MED ORDER — ONDANSETRON HCL 4 MG/2ML IJ SOLN
INTRAMUSCULAR | Status: AC
  Filled 2021-02-23: qty 4

## 2021-02-23 MED ORDER — PROPOFOL 10 MG/ML IV BOLUS
INTRAVENOUS | Status: DC | PRN
  Administered 2021-02-23: 200 mg via INTRAVENOUS

## 2021-02-23 MED ORDER — BUPIVACAINE-EPINEPHRINE (PF) 0.25% -1:200000 IJ SOLN
INTRAMUSCULAR | Status: AC
  Filled 2021-02-23: qty 30

## 2021-02-23 MED ORDER — POVIDONE-IODINE 10 % EX SWAB: 2.0000 "application " | Freq: Once | CUTANEOUS | Status: DC

## 2021-02-23 MED ORDER — VANCOMYCIN HCL 1000 MG IV SOLR
INTRAVENOUS | Status: AC
  Filled 2021-02-23: qty 1000

## 2021-02-23 MED ORDER — MEPERIDINE HCL 25 MG/ML IJ SOLN: 6.2500 mg | INTRAMUSCULAR | Status: DC | PRN

## 2021-02-23 MED ORDER — SUCCINYLCHOLINE CHLORIDE 200 MG/10ML IV SOSY
PREFILLED_SYRINGE | INTRAVENOUS | Status: AC
  Filled 2021-02-23: qty 10

## 2021-02-23 MED ORDER — BUPIVACAINE HCL (PF) 0.25 % IJ SOLN
INTRAMUSCULAR | Status: AC
  Filled 2021-02-23: qty 30

## 2021-02-23 MED ORDER — HYDROMORPHONE HCL 1 MG/ML IJ SOLN
INTRAMUSCULAR | Status: AC
  Filled 2021-02-23: qty 1

## 2021-02-23 MED ORDER — EPINEPHRINE PF 1 MG/ML IJ SOLN
INTRAMUSCULAR | Status: AC
  Filled 2021-02-23: qty 4

## 2021-02-23 MED ORDER — PHENYLEPHRINE 40 MCG/ML (10ML) SYRINGE FOR IV PUSH (FOR BLOOD PRESSURE SUPPORT)
PREFILLED_SYRINGE | INTRAVENOUS | Status: AC
  Filled 2021-02-23: qty 40

## 2021-02-23 MED ORDER — MIDAZOLAM HCL 2 MG/2ML IJ SOLN
INTRAMUSCULAR | Status: AC
  Administered 2021-02-23: 2 mg
  Filled 2021-02-23: qty 2

## 2021-02-23 MED ORDER — BUPIVACAINE-EPINEPHRINE (PF) 0.5% -1:200000 IJ SOLN
INTRAMUSCULAR | Status: AC
  Filled 2021-02-23: qty 1.8

## 2021-02-23 MED ORDER — POVIDONE-IODINE 7.5 % EX SOLN: Freq: Once | CUTANEOUS | Status: DC

## 2021-02-23 MED ORDER — PROMETHAZINE HCL 25 MG/ML IJ SOLN: 6.2500 mg | INTRAMUSCULAR | Status: DC | PRN

## 2021-02-23 MED ORDER — BUPIVACAINE HCL (PF) 0.25 % IJ SOLN
INTRAMUSCULAR | Status: DC | PRN
  Administered 2021-02-23: 30 mL

## 2021-02-23 MED ORDER — ROPIVACAINE HCL 7.5 MG/ML IJ SOLN
INTRAMUSCULAR | Status: DC | PRN
  Administered 2021-02-23: 20 mL via PERINEURAL

## 2021-02-23 MED ORDER — ACETAMINOPHEN 500 MG PO TABS
ORAL_TABLET | ORAL | Status: AC
  Administered 2021-02-23: 1000 mg via ORAL
  Filled 2021-02-23: qty 2

## 2021-02-23 MED ORDER — GABAPENTIN 300 MG PO CAPS
ORAL_CAPSULE | ORAL | Status: AC
  Administered 2021-02-23: 300 mg via ORAL
  Filled 2021-02-23: qty 1

## 2021-02-23 MED ORDER — VANCOMYCIN HCL POWD
Status: DC | PRN
  Administered 2021-02-23: 1000 mg via SURGICAL_CAVITY

## 2021-02-23 MED ORDER — POVIDONE-IODINE 10 % EX SWAB
2.0000 "application " | Freq: Once | CUTANEOUS | Status: AC
  Administered 2021-02-23: 2 via TOPICAL

## 2021-02-23 MED ORDER — CEFAZOLIN SODIUM-DEXTROSE 2-4 GM/100ML-% IV SOLN
2.0000 g | INTRAVENOUS | Status: AC
  Administered 2021-02-23 (×2): 2 g via INTRAVENOUS

## 2021-02-23 MED ORDER — GABAPENTIN 300 MG PO CAPS: 300.0000 mg | ORAL_CAPSULE | Freq: Once | ORAL | Status: AC

## 2021-02-23 MED ORDER — CLONIDINE HCL (ANALGESIA) 100 MCG/ML EP SOLN
EPIDURAL | Status: DC | PRN
  Administered 2021-02-23: 50 ug

## 2021-02-23 MED ORDER — LACTATED RINGERS IV SOLN: INTRAVENOUS | Status: DC | PRN

## 2021-02-23 MED ORDER — ACETAMINOPHEN 500 MG PO TABS: 1000.0000 mg | ORAL_TABLET | Freq: Once | ORAL | Status: AC

## 2021-02-23 SURGICAL SUPPLY — 107 items
ANCHOR BUTTON TIGHTROPE W/FC3 (Orthopedic Implant) ×6 IMPLANT
ANCHOR SUT BIO SW 4.75X19.1 (Anchor) ×3 IMPLANT
BANDAGE ESMARK 6X9 LF (GAUZE/BANDAGES/DRESSINGS) ×1 IMPLANT
BLADE EXCALIBUR 4.0MM X 13CM (MISCELLANEOUS) ×1
BLADE EXCALIBUR 4.0X13 (MISCELLANEOUS) ×2 IMPLANT
BLADE SURG 10 STRL SS (BLADE) ×9 IMPLANT
BLADE SURG 15 STRL LF DISP TIS (BLADE) ×2 IMPLANT
BLADE SURG 15 STRL SS (BLADE) ×4
BNDG ELASTIC 6X15 VLCR STRL LF (GAUZE/BANDAGES/DRESSINGS) ×3 IMPLANT
BNDG ESMARK 6X9 LF (GAUZE/BANDAGES/DRESSINGS) ×3
CANN ZONE NAVIGATOR LT DISP (ORTHOPEDIC DISPOSABLE SUPPLIES) ×1
CANNULA ZONE NAVIGATOR LT DISP (ORTHOPEDIC DISPOSABLE SUPPLIES) ×1 IMPLANT
CLOSURE STERI-STRIP 1/2X4 (GAUZE/BANDAGES/DRESSINGS) ×1
CLSR STERI-STRIP ANTIMIC 1/2X4 (GAUZE/BANDAGES/DRESSINGS) ×2 IMPLANT
COVER SURGICAL LIGHT HANDLE (MISCELLANEOUS) ×3 IMPLANT
COVER WAND RF STERILE (DRAPES) IMPLANT
CUFF TOURN SGL QUICK 34 (TOURNIQUET CUFF) ×2
CUFF TOURN SGL QUICK 42 (TOURNIQUET CUFF) IMPLANT
CUFF TRNQT CYL 34X4.125X (TOURNIQUET CUFF) ×1 IMPLANT
CUTTER BONE 4.0MM X 13CM (MISCELLANEOUS) ×3 IMPLANT
CUTTER TENSIONER SUT 2-0 0 FBW (INSTRUMENTS) ×3 IMPLANT
DECANTER SPIKE VIAL GLASS SM (MISCELLANEOUS) ×3 IMPLANT
DRAPE ARTHROSCOPY W/POUCH 114 (DRAPES) ×3 IMPLANT
DRAPE INCISE IOBAN 66X45 STRL (DRAPES) ×3 IMPLANT
DRAPE OEC MINIVIEW 54X84 (DRAPES) ×3 IMPLANT
DRAPE ORTHO SPLIT 77X108 STRL (DRAPES) ×4
DRAPE SURG ORHT 6 SPLT 77X108 (DRAPES) ×2 IMPLANT
DRAPE U-SHAPE 47X51 STRL (DRAPES) ×3 IMPLANT
DRSG PAD ABDOMINAL 8X10 ST (GAUZE/BANDAGES/DRESSINGS) ×9 IMPLANT
DRSG TEGADERM 4X4.5 CHG (GAUZE/BANDAGES/DRESSINGS) ×3 IMPLANT
DURAPREP 26ML APPLICATOR (WOUND CARE) ×6 IMPLANT
DW OUTFLOW CASSETTE/TUBE SET (MISCELLANEOUS) ×3 IMPLANT
ELECT REM PT RETURN 9FT ADLT (ELECTROSURGICAL) ×3
ELECTRODE REM PT RTRN 9FT ADLT (ELECTROSURGICAL) ×1 IMPLANT
FIBERSTICK 2 (SUTURE) ×3 IMPLANT
GAUZE SPONGE 4X4 12PLY STRL (GAUZE/BANDAGES/DRESSINGS) ×3 IMPLANT
GAUZE XEROFORM 1X8 LF (GAUZE/BANDAGES/DRESSINGS) ×3 IMPLANT
GAUZE XEROFORM 5X9 LF (GAUZE/BANDAGES/DRESSINGS) ×3 IMPLANT
GLOVE SRG 8 PF TXTR STRL LF DI (GLOVE) ×1 IMPLANT
GLOVE SURG ENC MOIS LTX SZ7 (GLOVE) ×6 IMPLANT
GLOVE SURG LTX SZ8 (GLOVE) ×3 IMPLANT
GLOVE SURG UNDER LTX SZ7 (GLOVE) ×3 IMPLANT
GLOVE SURG UNDER POLY LF SZ8 (GLOVE) ×2
GOWN STRL REUS W/ TWL LRG LVL3 (GOWN DISPOSABLE) ×3 IMPLANT
GOWN STRL REUS W/ TWL XL LVL3 (GOWN DISPOSABLE) ×1 IMPLANT
GOWN STRL REUS W/TWL LRG LVL3 (GOWN DISPOSABLE) ×6
GOWN STRL REUS W/TWL XL LVL3 (GOWN DISPOSABLE) ×2
IMMOBILIZER KNEE 22 UNIV (SOFTGOODS) ×3 IMPLANT
IMP SYS 2ND FIX PEEK 4.75X19.1 (Miscellaneous) ×3 IMPLANT
IMPL FIBERSTICH 2-0 CVD (Anchor) IMPLANT
IMPL FIBERSTITCH 2-0 CVD 24DEG (Anchor) ×3 IMPLANT
IMPL SYS 2ND FX PEEK 4.75X19.1 (Miscellaneous) ×1 IMPLANT
IMPL TIGHTROP ABS ACL FIBERTG (Orthopedic Implant) ×1 IMPLANT
IMPL TIGHTROPE ABS ACL FIBERTG (Orthopedic Implant) ×3 IMPLANT
IMPLANT FIBERSTICH 2-0 CVD (Anchor) IMPLANT
KIT BASIN OR (CUSTOM PROCEDURE TRAY) ×3 IMPLANT
KIT BIOCARTILAGE LG JOINT MIX (KITS) ×6 IMPLANT
KIT ROOT REPAIR MEINISCAL PEEK (Anchor) ×1 IMPLANT
KIT TURNOVER KIT B (KITS) ×3 IMPLANT
KNIFE BLADE PARALLEL SZ10 (BLADE) ×3 IMPLANT
MANIFOLD NEPTUNE II (INSTRUMENTS) ×3 IMPLANT
MEINISCAL ROOT REPAIR KIT PEEK (Anchor) ×3 IMPLANT
MID POST LF CANNULA AR7910L (ORTHOPEDIC DISPOSABLE SUPPLIES) ×2
NEEDLE 18GX1X1/2 (RX/OR ONLY) (NEEDLE) ×3 IMPLANT
NS IRRIG 1000ML POUR BTL (IV SOLUTION) ×3 IMPLANT
PACK ARTHROSCOPY DSU (CUSTOM PROCEDURE TRAY) ×3 IMPLANT
PAD ARMBOARD 7.5X6 YLW CONV (MISCELLANEOUS) ×6 IMPLANT
PAD CAST 4YDX4 CTTN HI CHSV (CAST SUPPLIES) ×1 IMPLANT
PAD COLD SHLDR WRAP-ON (PAD) ×3 IMPLANT
PADDING CAST COTTON 4X4 STRL (CAST SUPPLIES) ×2
PADDING CAST COTTON 6X4 STRL (CAST SUPPLIES) ×3 IMPLANT
PENCIL BUTTON HOLSTER BLD 10FT (ELECTRODE) IMPLANT
PK GRAFTLINK AUTO IMPLANT SYST (Anchor) ×3 IMPLANT
PORTAL SKID DEVICE (INSTRUMENTS) ×3 IMPLANT
PUTTY DBM ALLOSYNC PURE 2.5CC (Putty) ×6 IMPLANT
SPONGE LAP 18X18 RF (DISPOSABLE) ×3 IMPLANT
SPONGE LAP 4X18 RFD (DISPOSABLE) ×6 IMPLANT
SPONGE T-LAP 18X18 ~~LOC~~+RFID (SPONGE) ×6 IMPLANT
SUCTION FRAZIER HANDLE 10FR (MISCELLANEOUS) ×4
SUCTION TUBE FRAZIER 10FR DISP (MISCELLANEOUS) ×2 IMPLANT
SUT 2 FIBERLOOP 20 STRT BLUE (SUTURE)
SUT ETHILON 3 0 PS 1 (SUTURE) ×12 IMPLANT
SUT MNCRL AB 3-0 PS2 18 (SUTURE) ×9 IMPLANT
SUT MNCRL AB 4-0 PS2 18 (SUTURE) ×6 IMPLANT
SUT TIGER TAPE 7 IN WHITE (SUTURE) ×6 IMPLANT
SUT VIC AB 0 CT1 27 (SUTURE) ×8
SUT VIC AB 0 CT1 27XBRD ANBCTR (SUTURE) ×4 IMPLANT
SUT VIC AB 1 CT1 27 (SUTURE) ×14
SUT VIC AB 1 CT1 27XBRD ANBCTR (SUTURE) ×7 IMPLANT
SUT VIC AB 1 CTX 27 (SUTURE) ×3 IMPLANT
SUT VIC AB 2-0 CT1 27 (SUTURE) ×12
SUT VIC AB 2-0 CT1 TAPERPNT 27 (SUTURE) ×6 IMPLANT
SUT VICRYL 0 AB UR-6 (SUTURE) ×9 IMPLANT
SUTURE 2 FIBERLOOP 20 STRT BLU (SUTURE) IMPLANT
SUTURE TAPE 1.3 40 TPR END (SUTURE) ×2 IMPLANT
SUTURE TAPE 2-0 MENISCS NDL (SUTURE) ×5 IMPLANT
SUTURETAPE 1.3 40 TPR END (SUTURE) ×6
SUTURETAPE 2-0 MENISCS NDL (SUTURE) ×15
SYR 30ML LL (SYRINGE) ×3 IMPLANT
SYR BULB IRRIG 60ML STRL (SYRINGE) ×3 IMPLANT
SYR TB 1ML LUER SLIP (SYRINGE) ×3 IMPLANT
SYSTEM GRAFT IMPLANT AUTOGRAFT (Anchor) ×1 IMPLANT
TOWEL GREEN STERILE (TOWEL DISPOSABLE) ×6 IMPLANT
TOWEL GREEN STERILE FF (TOWEL DISPOSABLE) ×3 IMPLANT
TUBING ARTHROSCOPY IRRIG 16FT (MISCELLANEOUS) ×3 IMPLANT
UNDERPAD 30X36 HEAVY ABSORB (UNDERPADS AND DIAPERS) ×3 IMPLANT
WATER STERILE IRR 1000ML POUR (IV SOLUTION) ×3 IMPLANT

## 2021-02-23 NOTE — OR Nursing (Signed)
Intermitent cath post procedure. 700cc of urine.

## 2021-02-23 NOTE — Anesthesia Postprocedure Evaluation (Signed)
Anesthesia Post Note  Patient: Nathaniel Matthews  Procedure(s) Performed: LEFT KNEE  ANTERIOR CRUCIATE LIGAMENT (ACL) RECONSTRUCION WITH QUAD AUTOGRAFT, REPAIR OF MEDIAL COLLATERAL LIGAMENT AND  MEDIAL AND LATERAL MENISCAL REPAIR vs DEBRIDEMENT (Left: Knee)     Patient location during evaluation: PACU Anesthesia Type: General Level of consciousness: awake and alert, patient cooperative and oriented Pain management: pain level controlled Vital Signs Assessment: post-procedure vital signs reviewed and stable Respiratory status: spontaneous breathing, nonlabored ventilation and respiratory function stable Cardiovascular status: blood pressure returned to baseline and stable Postop Assessment: no apparent nausea or vomiting and adequate PO intake Anesthetic complications: no   No notable events documented.  Last Vitals:  Vitals:   02/23/21 2230 02/23/21 2244  BP: (!) 142/98 (!) 148/95  Pulse: 97 98  Resp: 12 15  Temp:  (!) 36.2 C  SpO2: 99% 99%    Last Pain:  Vitals:   02/23/21 2244  TempSrc:   PainSc: 5                  Kyaira Trantham,E. Germaine Shenker

## 2021-02-23 NOTE — Anesthesia Procedure Notes (Signed)
Procedure Name: Intubation Date/Time: 02/23/2021 3:53 PM Performed by: Eligha Bridegroom, CRNA Pre-anesthesia Checklist: Patient identified, Emergency Drugs available, Suction available, Patient being monitored and Timeout performed Oxygen Delivery Method: Circle system utilized Preoxygenation: Pre-oxygenation with 100% oxygen Induction Type: IV induction Ventilation: Mask ventilation without difficulty Laryngoscope Size: Mac and 4 Grade View: Grade I Tube type: Oral Tube size: 8.0 mm Number of attempts: 1 Airway Equipment and Method: Stylet Placement Confirmation: ETT inserted through vocal cords under direct vision, positive ETCO2 and breath sounds checked- equal and bilateral Secured at: 23 cm Tube secured with: Tape Dental Injury: Teeth and Oropharynx as per pre-operative assessment

## 2021-02-23 NOTE — Brief Op Note (Signed)
   02/23/2021  9:44 PM  PATIENT:  Binnie Kand  20 y.o. male  PRE-OPERATIVE DIAGNOSIS:  left knee meniscal ligament injury  POST-OPERATIVE DIAGNOSIS:  left knee meniscal ligament injury  PROCEDURE:  Procedure(s): LEFT KNEE  ANTERIOR CRUCIATE LIGAMENT (ACL) RECONSTRUCION WITH QUAD AUTOGRAFT, REPAIR OF MEDIAL COLLATERAL LIGAMENT AND  MEDIAL AND LATERAL MENISCAL REPAIR  SURGEON:  Surgeon(s): August Saucer, Corrie Mckusick, MD  ASSISTANT: magnant pa  ANESTHESIA:   general  EBL: 25 ml    Total I/O In: 1800 [I.V.:1800] Out: 750 [Urine:700; Blood:50]  BLOOD ADMINISTERED: none  DRAINS: none   LOCAL MEDICATIONS USED:  marcaine mso4 clonidine     SPECIMEN:  No Specimen  COUNTS:  YES  TOURNIQUET:   Total Tourniquet Time Documented: Thigh (Left) - 21 minutes Thigh (Left) - 120 minutes Total: Thigh (Left) - 141 minutes   DICTATION: .Other Dictation: Dictation Number [id number]  PLAN OF CARE: Discharge to home after PACU  PATIENT DISPOSITION:  PACU - hemodynamically stable

## 2021-02-23 NOTE — H&P (Signed)
Nathaniel Matthews is an 20 y.o. male.   Chief Complaint: left knee pain and instability HPI: [name] is a 20 year old patient who injured his left knee playing basketball on May 28.  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  He has been on crutches.  He has improved some over the past several days.  MRI scan is reviewed.  ACL is torn.  There is medial meniscal tear with horizontal and possible meniscocapsular injury.  There is also significant lateral meniscal pathology with avulsed meniscal tissue flipped anteriorly.  Very little if any meniscal tissue is present in the posterior aspect of the knee on the lateral side.  There is also proximal avulsion of the MCL.  No definite personal history of DVT but the patient is adopted so that part of his history is unknown  History reviewed. No pertinent past medical history.  Past Surgical History:  Procedure Laterality Date   WISDOM TOOTH EXTRACTION      Family History  Problem Relation Age of Onset   Healthy Mother    Healthy Father    Social History:  reports that he has been smoking cigars. He has never used smokeless tobacco. He reports current drug use. Drug: Marijuana. He reports that he does not drink alcohol.  Allergies: No Known Allergies  Medications Prior to Admission  Medication Sig Dispense Refill   acetaminophen (TYLENOL) 500 MG tablet Take 1,000 mg by mouth every 6 (six) hours as needed for moderate pain.     HYDROcodone-acetaminophen (NORCO) 5-325 MG tablet Take 1 tablet by mouth every 6 (six) hours as needed. 30 tablet 0   ibuprofen (ADVIL) 200 MG tablet Take 800 mg by mouth every 8 (eight) hours as needed for moderate pain.     cephALEXin (KEFLEX) 500 MG capsule Take 1 capsule (500 mg total) by mouth 2 (two) times daily. (Patient not taking: No sig reported) 14 capsule 0   HYDROcodone-acetaminophen (NORCO/VICODIN) 5-325 MG tablet Take 1 tablet by mouth every 4 (four) hours as needed. (Patient not taking: No sig  reported) 10 tablet 0    No results found for this or any previous visit (from the past 48 hour(s)). No results found.  Review of Systems  Musculoskeletal:  Positive for arthralgias.  All other systems reviewed and are negative.  Blood pressure (!) 129/91, pulse 89, temperature 97.6 F (36.4 C), temperature source Oral, resp. rate 18, height 6\' 4"  (1.93 m), weight 117.9 kg, SpO2 98 %. Physical Exam Vitals reviewed.  HENT:     Head: Normocephalic.     Nose: Nose normal.     Mouth/Throat:     Mouth: Mucous membranes are moist.  Eyes:     Pupils: Pupils are equal, round, and reactive to light.  Cardiovascular:     Rate and Rhythm: Normal rate.     Pulses: Normal pulses.  Pulmonary:     Effort: Pulmonary effort is normal.  Abdominal:     General: Abdomen is flat.  Musculoskeletal:     Cervical back: Normal range of motion.  Skin:    General: Skin is warm.     Capillary Refill: Capillary refill takes less than 2 seconds.  Neurological:     General: No focal deficit present.     Mental Status: He is alert.  Psychiatric:        Mood and Affect: Mood normal.     Ortho exam demonstrates range of motion of just below 10 degrees full extension to 115  of flexion.  Does have some MCL laxity to valgus stress at both 0 and 30 degrees.  Opens up about 2 mm more at 0 degrees compared to the right and about 4 mm more at 30 degrees compared to the right-hand side with valgus stress.  No posterior rotatory instability is present.  ACL laxity is present.  Extensor mechanism is intact.  Pedal pulses palpable.  Ankle dorsiflexion intact Assessment/Plan  Impression is significant multi ligament knee injury with meniscal pathology on both sides.  Pedal pulses palpable with ankle dorsiflexion intact.  Does have mild calf swelling and tenderness today but ultrasound performed by the time of this dictation was negative for DVT.  Plan is to continue with range of motion exercises and limited  weightbearing with crutches.  Needs to try to achieve full extension prior to surgery but that may be difficult with that flipped meniscal tissue present.  Acute inflammatory phase has passed in the 2 weeks since injury.  Plan ACL reconstruction with quad autograft and meniscal repair medially and laterally.  We will assess the MCL at the time of surgery.  Is possible that based on the amount of laxity present we may not need to fix the MCL.  The risk and benefits of the procedure are discussed with the patient including not limited to infection nerve vessel damage knee stiffness as well as the prolonged rehabilitation required with this injury.  Particularly with that lateral meniscal pathology present.  If the lateral meniscus is repaired it would likely require a meniscal root type repair.  Patient understands the risk and benefits and wishes to proceed.  All questions answered  Burnard Bunting, MD 02/23/2021, 2:53 PM

## 2021-02-23 NOTE — Anesthesia Procedure Notes (Signed)
Anesthesia Regional Block: Adductor canal block   Pre-Anesthetic Checklist: , timeout performed,  Correct Patient, Correct Site, Correct Laterality,  Correct Procedure, Correct Position, site marked,  Risks and benefits discussed,  Surgical consent,  Pre-op evaluation,  At surgeon's request and post-op pain management  Laterality: Left  Prep: chloraprep       Needles:  Injection technique: Single-shot  Needle Type: Echogenic Needle     Needle Length: 9cm  Needle Gauge: 21     Additional Needles:   Procedures:,,,, ultrasound used (permanent image in chart),,    Narrative:  Start time: 02/23/2021 2:50 PM End time: 02/23/2021 3:00 PM Injection made incrementally with aspirations every 5 mL.  Performed by: Personally  Anesthesiologist: Cecile Hearing, MD  Additional Notes: No pain on injection. No increased resistance to injection. Injection made in 5cc increments.  Good needle visualization.  Patient tolerated procedure well.

## 2021-02-23 NOTE — Anesthesia Preprocedure Evaluation (Addendum)
Anesthesia Evaluation  Patient identified by MRN, date of birth, ID band Patient awake    Reviewed: Allergy & Precautions, NPO status , Patient's Chart, lab work & pertinent test results  Airway Mallampati: II  TM Distance: >3 FB Neck ROM: Full    Dental  (+) Teeth Intact, Dental Advisory Given   Pulmonary neg pulmonary ROS, Current Smoker and Patient abstained from smoking.,    Pulmonary exam normal breath sounds clear to auscultation       Cardiovascular negative cardio ROS Normal cardiovascular exam Rhythm:Regular Rate:Normal     Neuro/Psych negative neurological ROS     GI/Hepatic negative GI ROS, Neg liver ROS,   Endo/Other  Obesity   Renal/GU negative Renal ROS     Musculoskeletal  left knee meniscal ligament injury   Abdominal   Peds  Hematology negative hematology ROS (+)   Anesthesia Other Findings Day of surgery medications reviewed with the patient.  Reproductive/Obstetrics                            Anesthesia Physical Anesthesia Plan  ASA: 2  Anesthesia Plan: General   Post-op Pain Management:  Regional for Post-op pain   Induction: Intravenous  PONV Risk Score and Plan: 2 and Midazolam, Dexamethasone and Ondansetron  Airway Management Planned: Oral ETT  Additional Equipment:   Intra-op Plan:   Post-operative Plan: Extubation in OR  Informed Consent: I have reviewed the patients History and Physical, chart, labs and discussed the procedure including the risks, benefits and alternatives for the proposed anesthesia with the patient or authorized representative who has indicated his/her understanding and acceptance.     Dental advisory given  Plan Discussed with: CRNA  Anesthesia Plan Comments:        Anesthesia Quick Evaluation

## 2021-02-23 NOTE — Transfer of Care (Signed)
Immediate Anesthesia Transfer of Care Note  Patient: Nathaniel Matthews  Procedure(s) Performed: LEFT KNEE  ANTERIOR CRUCIATE LIGAMENT (ACL) RECONSTRUCION WITH QUAD AUTOGRAFT, REPAIR OF MEDIAL COLLATERAL LIGAMENT AND  MEDIAL AND LATERAL MENISCAL REPAIR vs DEBRIDEMENT (Left: Knee)  Patient Location: PACU  Anesthesia Type:General  Level of Consciousness: awake, alert , oriented and patient cooperative  Airway & Oxygen Therapy: Patient Spontanous Breathing  Post-op Assessment: Report given to RN and Post -op Vital signs reviewed and stable  Post vital signs: Reviewed and stable  Last Vitals:  Vitals Value Taken Time  BP 148/94 02/23/21 2145  Temp 36.1 C 02/23/21 2145  Pulse 103 02/23/21 2150  Resp 20 02/23/21 2150  SpO2 97 % 02/23/21 2150  Vitals shown include unvalidated device data.  Last Pain:  Vitals:   02/23/21 1540  TempSrc:   PainSc: (P) 0-No pain      Patients Stated Pain Goal: (P) 3 (02/23/21 1540)  Complications: No notable events documented.

## 2021-02-24 MED FILL — Vancomycin HCl For IV Soln 1 GM (Base Equivalent): INTRAVENOUS | Qty: 1000 | Status: AC

## 2021-02-24 NOTE — Progress Notes (Signed)
Wasted 0.5 of dilaudid in the strecycle witnessed by Edison Simon, RN.

## 2021-02-24 NOTE — Op Note (Signed)
NAME: Nathaniel Matthews, Nathaniel Matthews MEDICAL RECORD NO: [id number] ACCOUNT NO: [id number] DATE OF BIRTH: 2001/08/23 FACILITY: [facility] LOCATION: [facility] PHYSICIAN: Graylin Shiver. August Saucer, MD  Operative Report   DATE OF PROCEDURE: 02/23/2021  PREOPERATIVE DIAGNOSIS:  Left knee multi-ligament injury with medial and lateral meniscal tears.  POSTOPERATIVE DIAGNOSES:  Left knee ACL tear, MCL tear, medial and lateral meniscal tear.  PROCEDURE: 1.  Left knee ACL reconstruction using quadriceps autograft. 2.  MCL repair of proximal avulsion. 3.  Inside-out lateral meniscal repair of complete bucket handle tear of the lateral meniscus. 4.  All-inside medial meniscal repair of partial thickness vertical tear through the posterior horn.  SURGEON ATTENDING:  Cammy Copa, MD  ASSISTANT:  Karenann Cai, PA  INDICATIONS:  The patient is a 20 year old patient with medial and lateral meniscal tears, ACL tear and MCL tear, who presents for operative management after explanation of risks and benefits.  He sustained basketball injury about 4 weeks ago.  Has his  posterior horn of the lateral meniscus in the anterior portion of the knee preventing full extension.  DESCRIPTION OF PROCEDURE:  The patient was brought to the operating room where general anesthetic was induced.  Preoperative antibiotics were administered.  Timeout was called.  Left knee was examined under anesthesia and found to have no posterolateral  rotatory instability.  The ACL was out.  The PCL intact.  The patient had no laxity to varus stress at 0 and 30 degrees, but did have about 3 mm of laxity to valgus stress at 0 degrees and about 6 mm at 30 degrees of flexion.  Left leg was pre-scrubbed  with alcohol and Betadine, allowed to air dry, prepped with DuraPrep solution and draped in a sterile manner.  Ioban used to cover the operative field.  A timeout was called.  Left leg was elevated, exsanguinated with the Esmarch wrap.  Tourniquet was  inflated.   Incision made at the proximal pole of the patella extending proximally.  Skin and subcutaneous tissue were sharply divided.  Quadriceps tendon was visualized.  The patient had a very robust and wide quadriceps tendon, 10 mm graft was  harvested, approximately 75 mm in length.  This was prepared on the back table by [facility] using dual EndoButton technique from [employer].  Next, the incision was irrigated and the quad tendon defect was closed using #1 Vicryl suture.  I then closed  using 0 Vicryl suture and 2-0 Vicryl suture.  Ioban with moist dressing then placed over that incision.  Tourniquet was released after 20 minutes.  Attention was then directed towards the knee arthroscopy portion.  Anterior inferolateral and anterior  inferomedial portals were established.  Diagnostic arthroscopy was performed.  The patient had intact patellofemoral compartment. No loose bodies in medial and lateral gutter.  ACL was torn.  PCL was intact.  The patient had flipped posterior horn of the  lateral meniscus anteriorly.  This was reduced.  The medial compartment was then inspected.  Evidence of ligamentous laxity was present medially.  The patient did have a near full-thickness vertical tear of about 1 cm area in the posterior horn of the  medial meniscus.  This was prepped with the Mace rasp and then repaired using all-inside technique, Arthrex device set at 16 mm.  Secure repair was achieved.  Next, attention was directed towards the notch.  Notchplasty was performed.  ACL stump debrided.   Attention was then directed towards the lateral meniscus.  Incision made at the posterolateral aspect of the  medial femoral condyle centered at the joint line extending distally.  Care was taken to avoid injury to the peroneal nerve.  Next, skin and  subcutaneous tissue were sharply divided.  Plane was developed between the superior aspect of the biceps femoris and the inferior aspect of the iliotibial band.  Evidence of some soft  tissue injury was present, but the lateral collateral ligament was  visualized and intact.  At this time, using inside-out technique, five vertical mattress sutures were placed into the meniscus, which did require some debridement.  They were placed spaced apart by about 5 mm.  All in all, a very secure repair was  achieved.  Luke retrieved the sutures posteriorly.  Sutures were then tied anterior to posterior with the knee flexed about 40 degrees.  Very secure repair was achieved.  Next, attention was directed towards the ACL reconstruction.  The femoral tunnel  was drilled in the 3 o'clock position using a 9.5 mm FlipCutter.  Set at 110 degrees due to the length of the graft, which was about 78 mm.  Plan was for 20 mm in each tunnel.  Next, tibial tunnel was drilled, set at 60 degrees.  This gave very nice  femoral and tibial tunnels.  Graft was passed and the tunnel was filled with bone graft.  Button flipping on the femoral side was confirmed with fluoroscopy.  Graft was then pulled 20 mm into the femoral socket, 20 into the tibial socket, tensioned in  extension with very good stability achieved.  At this time, the medial laxity persisted and tourniquet was released.  Total tourniquet time for that part was about 118 minutes.  Next, the incision was made over the proximal aspect of the MCL. Skin and  subcutaneous tissue were sharply divided, crossing branches of the saphenous nerve were protected when possible.  The MCL was then identified and had partially healed to the medial femoral condyle.  This was dissected free and 2 SutureTapes were placed  in grasping fashion in the MCL and brought proximally.  Reattached with the knee in varus at about 20 degrees of flexion using a SwiveLock.  This definitely gave improved stability to valgus testing in both 0 and 30 degrees.  Thorough irrigation was  performed.  The medial and lateral incisions were irrigated and closed using 0 Vicryl suture, 2-0 Vicryl  suture, and 3-0 Monocryl.  A 3-0 Monocryl was then used to finish closing the quad harvest incision.  Thorough irrigation of the knee joint was  performed.  Portals were closed using 2-0 Vicryl and 3-0 nylon.  Distally, the tibial drilling incision was closed using 2-0 Vicryl, 3-0 nylon.  Solution of Marcaine, morphine, clonidine injected into the knee for postoperative pain relief.  Next,  Xeroform, Steri-Strips and Tegaderms were placed and then followed by Ace wrap, IceMan and knee immobilizer.  A solution of Marcaine, morphine and clonidine was injected into the knee prior to becoming unsterile.  The patient tolerated the procedure well  without immediate complication.  Luke's assistance was required at all times during the case for retraction, opening, closing, mobilization of tissue, graft preparation.  His assistance was a medical necessity.   SHW D: 02/23/2021 9:53:27 pm T: 02/23/2021 11:18:00 pm  JOB: [id number]/ [id number]

## 2021-02-26 DIAGNOSIS — S83512A Sprain of anterior cruciate ligament of left knee, initial encounter: Secondary | ICD-10-CM

## 2021-02-26 DIAGNOSIS — S83282A Other tear of lateral meniscus, current injury, left knee, initial encounter: Secondary | ICD-10-CM

## 2021-02-26 DIAGNOSIS — S83242A Other tear of medial meniscus, current injury, left knee, initial encounter: Secondary | ICD-10-CM

## 2021-02-26 DIAGNOSIS — S83412A Sprain of medial collateral ligament of left knee, initial encounter: Secondary | ICD-10-CM

## 2021-02-27 ENCOUNTER — Encounter (HOSPITAL_COMMUNITY): Payer: Self-pay | Admitting: Orthopedic Surgery

## 2021-03-02 ENCOUNTER — Ambulatory Visit (INDEPENDENT_AMBULATORY_CARE_PROVIDER_SITE_OTHER): Payer: Medicaid Other | Admitting: Orthopedic Surgery

## 2021-03-02 DIAGNOSIS — Z9889 Other specified postprocedural states: Secondary | ICD-10-CM

## 2021-03-02 DIAGNOSIS — M25562 Pain in left knee: Secondary | ICD-10-CM

## 2021-03-05 ENCOUNTER — Encounter: Payer: Self-pay | Admitting: Orthopedic Surgery

## 2021-03-05 NOTE — Progress Notes (Signed)
Post-Op Visit Note   Patient: Nathaniel Matthews           Date of Birth: Feb 03, 2001           MRN: [id number] Visit Date: 03/02/2021 PCP: Bernadette Hoit, MD   Assessment & Plan:  Chief Complaint:  Chief Complaint  Patient presents with   Left Knee - Routine Post Op    02/23/21--LEFT KNEE  ANTERIOR CRUCIATE LIGAMENT (ACL) RECONSTRUCION WITH QUAD AUTOGRAFT, REPAIR OF MEDIAL COLLATERAL LIGAMENT AND  MEDIAL AND LATERAL MENISCAL REPAIR vs DEBRIDEMENT    Visit Diagnoses: No diagnosis found.  Plan: Patient is a 20 year old male who presents s/p left knee anterior cruciate ligament reconstruction with quadricep autograft, MCL repair, all inside medial meniscal repair and inside-out lateral meniscal repair.  Patient complains that first 2 days he had severe pain but it has now gone to the point where he can tolerate his pain.  He has a lot of swelling and complains of majority of the pain in his mid quad.  He is up to 75 degrees on the CPM machine.  Currently nonweightbearing as directed.  Taking oxycodone about every 4 hours.  Passive extension to about 10 degrees of the right knee.  Anterior cruciate ligament graft is intact on Lachman exam.  No calf tenderness.  Negative Homans' sign.  No complaint of calf pain, shortness of breath, chest pain.  He is taking aspirin every day as directed.    He does have positive effusion on exam and 20 cc of blood was aspirated from the knee today.  Plan for him to remain nonweightbearing and to focus on achieving full extension over the next 10 days.  He will also use CPM machine.  Follow-up in 10 days for clinical recheck with Dr. August Saucer and likely initiate early physical therapy at that time to focus on passive motion of the knee as well as quadricep strengthening exercises with blood flow restriction therapy.  Also consider obtaining a Bledsoe brace at the next appointment.  For now he will remain in his black knee immobilizer at all times when he is ambulatory but  he may take it off when he is sedentary.  Follow-Up Instructions: No follow-ups on file.   Orders:  No orders of the defined types were placed in this encounter.  No orders of the defined types were placed in this encounter.   Imaging: No results found.  PMFS History: Patient Active Problem List   Diagnosis Date Noted   Left anterior cruciate ligament tear    Acute medial meniscus tear of left knee    Acute lateral meniscus tear of left knee    Complete tear of MCL of knee, left, initial encounter    No past medical history on file.  Family History  Problem Relation Age of Onset   Healthy Mother    Healthy Father     Past Surgical History:  Procedure Laterality Date   ANTERIOR CRUCIATE LIGAMENT REPAIR Left 02/23/2021   Procedure: LEFT KNEE  ANTERIOR CRUCIATE LIGAMENT (ACL) RECONSTRUCION WITH QUAD AUTOGRAFT, REPAIR OF MEDIAL COLLATERAL LIGAMENT AND  MEDIAL AND LATERAL MENISCAL REPAIR vs DEBRIDEMENT;  Surgeon: Cammy Copa, MD;  Location: [facility] OR;  Service: Orthopedics;  Laterality: Left;   WISDOM TOOTH EXTRACTION     Social History   Occupational History   Not on file  Tobacco Use   Smoking status: Some Days    Pack years: 0.00    Types: Cigars   Smokeless tobacco: Never  Tobacco comments:    Black and mild -and vapes  Vaping Use   Vaping Use: Never used  Substance and Sexual Activity   Alcohol use: Never   Drug use: Yes    Types: Marijuana    Comment: Last use 12/2020   Sexual activity: Not Currently

## 2021-03-05 NOTE — Progress Notes (Signed)
   Procedure Note  Patient: Nathaniel Matthews             Date of Birth: 03/21/2001           MRN: [id number]             Visit Date: 03/02/2021  Procedures: Visit Diagnoses:  1. Status post reconstruction of anterior cruciate ligament     Large Joint Inj: L knee on 03/02/2021 12:15 PM Indications: diagnostic evaluation, joint swelling and pain Details: 18 G 1.5 in needle, superolateral approach  Arthrogram: No  Medications: 5 mL lidocaine 1 % Aspirate: 20 mL bloody Outcome: tolerated well, no immediate complications Procedure, treatment alternatives, risks and benefits explained, specific risks discussed. Consent was given by the patient. Immediately prior to procedure a time out was called to verify the correct patient, procedure, equipment, support staff and site/side marked as required. Patient was prepped and draped in the usual sterile fashion.

## 2021-03-06 MED ORDER — LIDOCAINE HCL 1 % IJ SOLN
5.0000 mL | INTRAMUSCULAR | Status: AC | PRN
  Administered 2021-03-02: 5 mL

## 2021-03-13 ENCOUNTER — Encounter: Payer: Self-pay | Admitting: Surgical

## 2021-03-13 ENCOUNTER — Ambulatory Visit (INDEPENDENT_AMBULATORY_CARE_PROVIDER_SITE_OTHER): Payer: Medicaid Other | Admitting: Surgical

## 2021-03-13 ENCOUNTER — Other Ambulatory Visit: Payer: Self-pay

## 2021-03-13 DIAGNOSIS — Z9889 Other specified postprocedural states: Secondary | ICD-10-CM

## 2021-03-13 NOTE — Progress Notes (Signed)
Post-Op Visit Note   Patient: Nathaniel Matthews           Date of Birth: 10/14/2000           MRN: [id number] Visit Date: 03/13/2021 PCP: Bernadette Hoit, MD   Assessment & Plan:  Chief Complaint:  Chief Complaint  Patient presents with   Left Knee - Routine Post Op   Visit Diagnoses: No diagnosis found.  Plan: Patient is a 20 year old male who presents s/p left knee anterior cruciate ligament reconstruction with quadricep autograft, MCL repair, medial and lateral meniscal repairs on 02/23/2021.  He is currently ambulating nonweightbearing with crutches and reports that his pain and swelling are improving steadily.  He denies any fevers, chills, night sweats, drainage from the incisions.  On exam incisions are healing well without any evidence of infection or dehiscence.  He is able to weakly extend his knee but still has a little bit of trouble performing his straight leg raise without extensor lag.  Quad function is definitely improved compared with prior examination no.  ACL is stable on Lachman exam and by anterior drawer.  He is able to flex the knee to 90 degrees easily and he extends to between 5 to 10 degrees.  After relaxing definitely gets to about 5 degrees of extension.  No significant effusion.  Overall he is progressing very well.  Plan to continue with CPM machine until descended back this week and then just focus on keeping the 90 degrees of flexion he has with no flexion past 90 degrees.  The main thing that he needs to focus on in the next couple weeks is achieving full extension but he is making good progress on this so anticipate he will be around 0 degrees of extension by his next appointment in 2 weeks.  Follow-up in 2 weeks for clinical recheck with Dr. August Saucer.  Plan to progress patient to begin weightbearing at that appointment in 2 weeks.  Will refer patient to physical therapy upstairs so that he will be starting around the time of his next appointment in 2 weeks.  No complaint  of calf pain, chest pain, shortness of breath.  He will continue taking aspirin daily.  Follow-Up Instructions: No follow-ups on file.   Orders:  No orders of the defined types were placed in this encounter.  No orders of the defined types were placed in this encounter.   Imaging: No results found.  PMFS History: Patient Active Problem List   Diagnosis Date Noted   Left anterior cruciate ligament tear    Acute medial meniscus tear of left knee    Acute lateral meniscus tear of left knee    Complete tear of MCL of knee, left, initial encounter    No past medical history on file.  Family History  Problem Relation Age of Onset   Healthy Mother    Healthy Father     Past Surgical History:  Procedure Laterality Date   ANTERIOR CRUCIATE LIGAMENT REPAIR Left 02/23/2021   Procedure: LEFT KNEE  ANTERIOR CRUCIATE LIGAMENT (ACL) RECONSTRUCION WITH QUAD AUTOGRAFT, REPAIR OF MEDIAL COLLATERAL LIGAMENT AND  MEDIAL AND LATERAL MENISCAL REPAIR vs DEBRIDEMENT;  Surgeon: Cammy Copa, MD;  Location: [facility] OR;  Service: Orthopedics;  Laterality: Left;   WISDOM TOOTH EXTRACTION     Social History   Occupational History   Not on file  Tobacco Use   Smoking status: Some Days    Pack years: 0.00    Types: Cigars  Smokeless tobacco: Never   Tobacco comments:    Black and mild -and vapes  Vaping Use   Vaping Use: Never used  Substance and Sexual Activity   Alcohol use: Never   Drug use: Yes    Types: Marijuana    Comment: Last use 12/2020   Sexual activity: Not Currently

## 2021-03-29 ENCOUNTER — Ambulatory Visit (INDEPENDENT_AMBULATORY_CARE_PROVIDER_SITE_OTHER): Payer: Medicaid Other | Admitting: Orthopedic Surgery

## 2021-03-29 ENCOUNTER — Encounter: Payer: Self-pay | Admitting: Orthopedic Surgery

## 2021-03-29 DIAGNOSIS — Z9889 Other specified postprocedural states: Secondary | ICD-10-CM

## 2021-03-29 NOTE — Progress Notes (Signed)
   Post-Op Visit Note   Patient: Nathaniel Matthews           Date of Birth: [birth year]           MRN: [id number] Visit Date: 03/29/2021 PCP: Bernadette Hoit, MD   Assessment & Plan:  Chief Complaint:  Chief Complaint  Patient presents with   Left Knee - Routine Post Op   Visit Diagnoses:  1. Status post reconstruction of anterior cruciate ligament     Plan: Patient is a 20 year old male who presents s/p left knee anterior cruciate ligament reconstruction with quadricep autograft, MCL repair, lateral meniscal all inside repair, medial meniscal inside-out repair.  Reports he is slowly doing better and does not really have much in the way of pain.  Currently nonweightbearing with crutches.  Denies any chest pain, shortness of breath, calf pain.  0 degrees extension to 110 degrees of knee flexion on exam today.  Positive effusion.  ACL stable on Lachman exam and anterior drawer.  MCL stable at 0 and 30 degrees. No calf tenderness.  Negative Homans' sign.  Able to perform straight leg raise without extensor lag.  Excellent quadricep strength.  Incisions healing well without any evidence of infection or dehiscence.  Plan to progress patient to partial weightbearing with crutches and he will wean off the crutches to full weightbearing over the next week as he can tolerate.  Start physical therapy upstairs to focus on range of motion and quadricep strengthening with no loaded flexion past 90 degrees to protect the meniscal repairs.  Follow-up in 8 weeks for clinical recheck.  Follow-Up Instructions: No follow-ups on file.   Orders:  Orders Placed This Encounter  Procedures   Ambulatory referral to Physical Therapy   No orders of the defined types were placed in this encounter.   Imaging: No results found.  PMFS History: Patient Active Problem List   Diagnosis Date Noted   Left anterior cruciate ligament tear    Acute medial meniscus tear of left knee    Acute lateral meniscus tear of left  knee    Complete tear of MCL of knee, left, initial encounter    No past medical history on file.  Family History  Problem Relation Age of Onset   Healthy Mother    Healthy Father     Past Surgical History:  Procedure Laterality Date   ANTERIOR CRUCIATE LIGAMENT REPAIR Left 02/23/2021   Procedure: LEFT KNEE  ANTERIOR CRUCIATE LIGAMENT (ACL) RECONSTRUCION WITH QUAD AUTOGRAFT, REPAIR OF MEDIAL COLLATERAL LIGAMENT AND  MEDIAL AND LATERAL MENISCAL REPAIR vs DEBRIDEMENT;  Surgeon: Cammy Copa, MD;  Location: [facility] OR;  Service: Orthopedics;  Laterality: Left;   WISDOM TOOTH EXTRACTION     Social History   Occupational History   Not on file  Tobacco Use   Smoking status: Some Days    Types: Cigars   Smokeless tobacco: Never   Tobacco comments:    Black and mild -and vapes  Vaping Use   Vaping Use: Never used  Substance and Sexual Activity   Alcohol use: Never   Drug use: Yes    Types: Marijuana    Comment: Last use 12/2020   Sexual activity: Not Currently

## 2021-04-04 ENCOUNTER — Ambulatory Visit: Payer: Medicaid Other | Attending: Orthopedic Surgery | Admitting: Physical Therapy

## 2021-04-04 ENCOUNTER — Other Ambulatory Visit: Payer: Self-pay

## 2021-04-04 ENCOUNTER — Encounter: Payer: Self-pay | Admitting: Physical Therapy

## 2021-04-04 DIAGNOSIS — M25562 Pain in left knee: Secondary | ICD-10-CM | POA: Diagnosis present

## 2021-04-04 DIAGNOSIS — R600 Localized edema: Secondary | ICD-10-CM | POA: Insufficient documentation

## 2021-04-04 DIAGNOSIS — G8929 Other chronic pain: Secondary | ICD-10-CM | POA: Insufficient documentation

## 2021-04-04 DIAGNOSIS — R2689 Other abnormalities of gait and mobility: Secondary | ICD-10-CM | POA: Diagnosis present

## 2021-04-04 DIAGNOSIS — M6281 Muscle weakness (generalized): Secondary | ICD-10-CM | POA: Diagnosis present

## 2021-04-04 NOTE — Therapy (Signed)
[facility] [address] [place], [place], [address] Phone: [phone]   Fax:  [phone]  Physical Therapy Evaluation  Patient Details  Name: Nathaniel Matthews MRN: [id number] Date of Birth: 12-31-2000 Referring Provider (PT): gregory scott dean MD   Encounter Date: 04/04/2021   PT End of Session - 04/04/21 1337     Visit Number 1    Number of Visits 17    Date for PT Re-Evaluation 05/30/21    Authorization Type [employer] MCD    PT Start Time 1338    PT Stop Time 1413    PT Time Calculation (min) 35 min    Activity Tolerance Patient tolerated treatment well    Behavior During Therapy [facility] for tasks assessed/performed             History reviewed. No pertinent past medical history.  Past Surgical History:  Procedure Laterality Date   ANTERIOR CRUCIATE LIGAMENT REPAIR Left 02/23/2021   Procedure: LEFT KNEE  ANTERIOR CRUCIATE LIGAMENT (ACL) RECONSTRUCION WITH QUAD AUTOGRAFT, REPAIR OF MEDIAL COLLATERAL LIGAMENT AND  MEDIAL AND LATERAL MENISCAL REPAIR vs DEBRIDEMENT;  Surgeon: Cammy Copaean, Gregory Scott, MD;  Location: [facility] OR;  Service: Orthopedics;  Laterality: Left;   WISDOM TOOTH EXTRACTION      There were no vitals filed for this visit.    Subjective Assessment - 04/04/21 1340     Subjective pt was playing basketball and twisted and heard a loud crack and resulted in a torn ACL. he had repair done on 6/23. He reports since surgery the first 2 days were the worst but after that it was fine. states he was release from using a brace or his crutches and notes only having some ache with prolonged sitting.    How long can you sit comfortably? unlimited    How long can you stand comfortably? 20 - 30 min    How long can you walk comfortably? tough to tell    Patient Stated Goals get back to normal.    Currently in Pain? Yes    Pain Score 0-No pain   4/10 at worst with sitting   Pain Orientation Left    Pain Descriptors / Indicators Aching     Pain Type Surgical pain    Pain Onset More than a month ago    Pain Frequency Intermittent    Aggravating Factors  prolonged sitting    Pain Relieving Factors getting up and moving aroudn                [facility] Assessment - 04/04/21 0001       Assessment   Medical Diagnosis Status post reconstruction of anterior cruciate ligament (O96.295(Z98.890)    Referring Provider (PT) gregory scott dean MD    Onset Date/Surgical Date 02/23/21    Hand Dominance Right    Next MD Visit 05/24/2021    Prior Therapy no      Precautions   Precaution Comments no running, juming, squating      Restrictions   Other Position/Activity Restrictions no loading the knee beyond 90 degrees,      Balance Screen   Has the patient fallen in the past 6 months No    Has the patient had a decrease in activity level because of a fear of falling?  No    Is the patient reluctant to leave their home because of a fear of falling?  No      Home [employer]   Living Environment Private residence  Living Arrangements Parent    Available Help at Discharge Family    Type of Home House    Home Access Stairs to enter    Entrance Stairs-Number of Steps 3    Entrance Stairs-Rails Can reach both    Home Layout One level    Home Equipment Crutches    Additional Comments brace      Prior Function   Level of Independence Independent    Vocation Unemployed      Cognition   Overall Cognitive Status Within Functional Limits for tasks assessed      Observation/Other Assessments   Other Surveys  Lower Extremity Functional Scale    Lower Extremity Functional Scale  46/80      Observation/Other Assessments-Edema    Edema Circumferential      Circumferential Edema   Circumferential - Right At joint line 42.6 cm    Circumferential - Left  --   at joint line 49.2 cm, 10 cm belo 40.2 cm, 10 cm above 50.3     Posture/Postural Control   Posture/Postural Control No significant limitations      ROM / Strength   AROM  / PROM / Strength AROM;Strength;PROM      AROM   AROM Assessment Site Knee    Right/Left Knee Left;Right    Right Knee Extension 136    Right Knee Flexion --   +20   Left Knee Extension 8    Left Knee Flexion 100      PROM   PROM Assessment Site Knee    Right/Left Knee Left;Right    Left Knee Extension 5    Left Knee Flexion 108      Strength   Overall Strength Due to precautions;Unable to assess    Strength Assessment Site Hip;Knee      Palpation   Palpation comment no specific area of tenderness noted.      Ambulation/Gait   Gait Pattern Step-to pattern;Decreased stride length;Decreased step length - right;Decreased stance time - left;Trendelenburg;Antalgic                        Objective measurements completed on examination: See above findings.               PT Education - 04/04/21 1406     Education Details evaluation findings, POC, goals, HEP with proper form/ rationale, reviewed outcome measure.    Person(s) Educated Patient    Methods Explanation;Verbal cues;Handout    Comprehension Verbalized understanding;Verbal cues required              PT Short Term Goals - 04/04/21 1459       PT SHORT TERM GOAL #1   Title pt to be be IND with inital HEP    Baseline no previous HEP    Time 4    Period Weeks    Status New    Target Date 05/02/21      PT SHORT TERM GOAL #2   Title pt to exhibit reduction of L knee edema by >/= .5 cm to promote knee ROM    Baseline see flow sheet    Time 4    Period Weeks    Status New    Target Date 05/02/21      PT SHORT TERM GOAL #3   Title pt to demo efficent step througho pattern with heel strike/ toe off with limited to no antalgic pattern    Baseline no heel strike/ toe off, step  to pattern    Time 4    Period Weeks    Status New    Target Date 05/02/21               PT Long Term Goals - 04/04/21 1559       PT LONG TERM GOAL #1   Title pt to increase L knee total arc ROM to  >/= 3 - 125 degrees for functional knee ROM required for efficent gait pattern    Baseline see flowsheet    Time 8    Period Weeks    Status New    Target Date 05/30/21      PT LONG TERM GOAL #2   Title pt to increase LLE strenght to 5/5 to promote knee stability    Baseline unable to test MMT    Time 8    Period Weeks    Status New    Target Date 05/30/21      PT LONG TERM GOAL #3   Title increase LEFS to >/= 75/80 to demo improvement in function    Baseline 46/80    Time 8    Period Weeks    Status New    Target Date 05/30/21      PT LONG TERM GOAL #4   Title pt to be able perofrm dynamic / plyometric utilzing double/ single LE with no report of pain or limtations for return to sport and pt goals.    Baseline unable to perform dynamic / plyometric activities    Time 8    Period Weeks    Status New    Target Date 05/30/21      PT LONG TERM GOAL #5   Title pt to be IND with advanced HEP and is able to maintain and progress current LOF  IND    Baseline no previous HEP    Time 8    Period Weeks    Status New    Target Date 05/30/21                    Plan - 04/04/21 1439     Clinical Impression Statement pt prsents to OPPT s/p L ACL reconstruction on 6/23. He has limited knee flexion/ extension compared bil. Held MMT today due to precautions/ limitations. He currently ambulates without a brace or crutches exhibiting antalgic step to pattern with limited heel strike/ toe off. he would benefit from physical therapy to decrease L knee pain / stiffness, improve ROM and strength and maximize his funciton by addressing the deficits listed.    Stability/Clinical Decision Making Stable/Uncomplicated    Clinical Decision Making Low    Rehab Potential Good    PT Frequency 2x / week    PT Duration 8 weeks    PT Treatment/Interventions ADLs/Self Care Home Management;Electrical Stimulation;Cryotherapy;Iontophoresis 4mg /ml Dexamethasone;Moist Heat;Ultrasound;Therapeutic  exercise;Therapeutic activities;Balance training;Gait training;Neuromuscular re-education;Patient/family education;Stair training;Manual techniques;Passive range of motion;Dry needling;Taping;Vasopneumatic Device    PT Next Visit Plan review/ update HEP PRN, pt is 6 weeks out on 8/4, knee ROM, quad activation, hipo strengthening, gait training.    PT Home Exercise Plan [facility]- quad set, SLR, heel side with strap, standing heel raise, sidelying hip abduction    Consulted and Agree with Plan of Care Patient             Patient will benefit from skilled therapeutic intervention in order to improve the following deficits and impairments:  Improper body mechanics, Increased muscle spasms, Decreased strength, Pain, Postural  dysfunction, Abnormal gait, Decreased activity tolerance, Decreased endurance, Increased edema  Visit Diagnosis: Chronic pain of left knee  Localized edema  Muscle weakness (generalized)  Other abnormalities of gait and mobility     Problem List Patient Active Problem List   Diagnosis Date Noted   Left anterior cruciate ligament tear    Acute medial meniscus tear of left knee    Acute lateral meniscus tear of left knee    Complete tear of MCL of knee, left, initial encounter    Lulu Riding PT, DPT, LAT, ATC  04/04/21  4:04 PM      [facility] [address] [place], [place], [address] Phone: [phone]   Fax:  [phone]  Name: Suzanne Garbers MRN: [id number] Date of Birth: Dec 08, 2000      Check all possible CPT codes: 97110- Therapeutic Exercise, [phone]- Neuro Re-education, [phone] - Gait Training, [phone] - Manual Therapy, 97530 - Therapeutic Activities, 97535 - Self Care, [id number] - Electrical stimulation (unattended), [id number] - Iontophoresis, [id number] - Ultrasound, [id number] - Vaso, and [id number] - Physical performance training

## 2021-04-11 ENCOUNTER — Ambulatory Visit: Payer: Medicaid Other

## 2021-04-11 ENCOUNTER — Other Ambulatory Visit: Payer: Self-pay

## 2021-04-11 DIAGNOSIS — G8929 Other chronic pain: Secondary | ICD-10-CM

## 2021-04-11 DIAGNOSIS — M25562 Pain in left knee: Secondary | ICD-10-CM | POA: Diagnosis not present

## 2021-04-11 DIAGNOSIS — R2689 Other abnormalities of gait and mobility: Secondary | ICD-10-CM

## 2021-04-11 DIAGNOSIS — M6281 Muscle weakness (generalized): Secondary | ICD-10-CM

## 2021-04-11 DIAGNOSIS — R600 Localized edema: Secondary | ICD-10-CM

## 2021-04-11 NOTE — Patient Instructions (Signed)
  [facility]

## 2021-04-11 NOTE — Therapy (Signed)
[facility] [address] [place], [place], [address] Phone: [phone]   Fax:  [phone]  Physical Therapy Treatment  Patient Details  Name: Nathaniel Matthews MRN: [id number] Date of Birth: 09-Jan-2001 Referring Provider (PT): gregory scott dean MD   Encounter Date: 04/11/2021   PT End of Session - 04/11/21 1533     Visit Number 2    Number of Visits 17    Date for PT Re-Evaluation 05/30/21    Authorization Type U[employer] MCD    PT Start Time 1450    PT Stop Time 1535    PT Time Calculation (min) 45 min    Activity Tolerance Patient tolerated treatment well    Behavior During Therapy [facility] for tasks assessed/performed             History reviewed. No pertinent past medical history.  Past Surgical History:  Procedure Laterality Date   ANTERIOR CRUCIATE LIGAMENT REPAIR Left 02/23/2021   Procedure: LEFT KNEE  ANTERIOR CRUCIATE LIGAMENT (ACL) RECONSTRUCION WITH QUAD AUTOGRAFT, REPAIR OF MEDIAL COLLATERAL LIGAMENT AND  MEDIAL AND LATERAL MENISCAL REPAIR vs DEBRIDEMENT;  Surgeon: Cammy Copa, MD;  Location: [facility] OR;  Service: Orthopedics;  Laterality: Left;   WISDOM TOOTH EXTRACTION      There were no vitals filed for this visit.   Subjective Assessment - 04/11/21 1449     Subjective Pt reports no pain today and adds that he has been adherent to his HEP. He reports that he hasn't been walking with a limp since starting PT. He states he is ready to begin treatment today.    Currently in Pain? No/denies    Pain Score 0-No pain                               [facility] Adult PT Treatment/Exercise - 04/11/21 0001       Knee/Hip Exercises: Standing   Rebounder x20 forward with 2kg ball, x20 each side with 1kg ball    Walking with Sports Cord forward, backward, side, side x70ft each with green cord    Other Standing Knee Exercises Heel taps on 4" steps in // bars      Knee/Hip Exercises: Seated   Long Arc Quad  Strengthening   BFR 75% occlusion pressure 30/15/15/15                   PT Education - 04/11/21 1533     Education Details Educated on effects of BFR, as well as proper form when performing HEP.    Person(s) Educated Patient    Methods Explanation;Verbal cues;Handout;Demonstration    Comprehension Returned demonstration;Verbalized understanding;Verbal cues required              PT Short Term Goals - 04/04/21 1459       PT SHORT TERM GOAL #1   Title pt to be be IND with inital HEP    Baseline no previous HEP    Time 4    Period Weeks    Status New    Target Date 05/02/21      PT SHORT TERM GOAL #2   Title pt to exhibit reduction of L knee edema by >/= .5 cm to promote knee ROM    Baseline see flow sheet    Time 4    Period Weeks    Status New    Target Date 05/02/21      PT SHORT TERM  GOAL #3   Title pt to demo efficent step througho pattern with heel strike/ toe off with limited to no antalgic pattern    Baseline no heel strike/ toe off, step to pattern    Time 4    Period Weeks    Status New    Target Date 05/02/21               PT Long Term Goals - 04/04/21 1559       PT LONG TERM GOAL #1   Title pt to increase L knee total arc ROM to >/= 3 - 125 degrees for functional knee ROM required for efficent gait pattern    Baseline see flowsheet    Time 8    Period Weeks    Status New    Target Date 05/30/21      PT LONG TERM GOAL #2   Title pt to increase LLE strenght to 5/5 to promote knee stability    Baseline unable to test MMT    Time 8    Period Weeks    Status New    Target Date 05/30/21      PT LONG TERM GOAL #3   Title increase LEFS to >/= 75/80 to demo improvement in function    Baseline 46/80    Time 8    Period Weeks    Status New    Target Date 05/30/21      PT LONG TERM GOAL #4   Title pt to be able perofrm dynamic / plyometric utilzing double/ single LE with no report of pain or limtations for return to sport and pt  goals.    Baseline unable to perform dynamic / plyometric activities    Time 8    Period Weeks    Status New    Target Date 05/30/21      PT LONG TERM GOAL #5   Title pt to be IND with advanced HEP and is able to maintain and progress current LOF  IND    Baseline no previous HEP    Time 8    Period Weeks    Status New    Target Date 05/30/21                   Plan - 04/11/21 1543     Clinical Impression Statement Pt is 6 weeks, 5 days s/p L knee ACL autograft w/ MCL repair and medial and lateral meniscal repair on 02/23/2021. Pt responded well to all treatment today with proper form and no increase in pain with any of the selected exercises. BFR provided a particular challenge as the pt was fatigued at the end of the exercise. He will continue to benefit from skilled PT to address his primary impairments and return to his prior level of function without limitation. Utilizing [facility] ACL reconstruction protocol during POC.    Stability/Clinical Decision Making Stable/Uncomplicated    Clinical Decision Making Low    Rehab Potential Good    PT Frequency 2x / week    PT Duration 8 weeks    PT Treatment/Interventions ADLs/Self Care Home Management;Electrical Stimulation;Cryotherapy;Iontophoresis 4mg /ml Dexamethasone;Moist Heat;Ultrasound;Therapeutic exercise;Therapeutic activities;Balance training;Gait training;Neuromuscular re-education;Patient/family education;Stair training;Manual techniques;Passive range of motion;Dry needling;Taping;Vasopneumatic Device    PT Next Visit Plan review/ update HEP PRN knee ROM, quad activation, hipo strengthening, gait training.    PT Home Exercise Plan [facility]- quad set, SLR, heel side with strap, standing heel raise, sidelying hip abduction    Consulted and  Agree with Plan of Care Patient             Patient will benefit from skilled therapeutic intervention in order to improve the following deficits and  impairments:  Improper body mechanics, Increased muscle spasms, Decreased strength, Pain, Postural dysfunction, Abnormal gait, Decreased activity tolerance, Decreased endurance, Increased edema  Visit Diagnosis: Chronic pain of left knee  Localized edema  Muscle weakness (generalized)  Other abnormalities of gait and mobility     Problem List Patient Active Problem List   Diagnosis Date Noted   Left anterior cruciate ligament tear    Acute medial meniscus tear of left knee    Acute lateral meniscus tear of left knee    Complete tear of MCL of knee, left, initial encounter     Carmelina Dane, PT, DPT 04/11/21 3:49 PM   [facility] [address] [place], [place], [address] Phone: [phone]   Fax:  [phone]  Name: Nathaniel Matthews MRN: [id number] Date of Birth: 01-12-2001

## 2021-04-14 ENCOUNTER — Telehealth: Payer: Self-pay | Admitting: Physical Therapy

## 2021-04-14 ENCOUNTER — Ambulatory Visit: Payer: Medicaid Other | Admitting: Physical Therapy

## 2021-04-14 NOTE — Telephone Encounter (Signed)
Spoke with patients mom regarding today's missed appointment today. I reviewed his next appointments and if he cannot make them to please call before his appointments and we can work to cancel/ reschedule them for him. She confirmed he will be at his next appointment.  Tanzania Basham PT, DPT, LAT, ATC  04/14/21  2:54 PM

## 2021-04-18 ENCOUNTER — Ambulatory Visit: Payer: Medicaid Other

## 2021-04-18 ENCOUNTER — Other Ambulatory Visit: Payer: Self-pay

## 2021-04-18 DIAGNOSIS — R600 Localized edema: Secondary | ICD-10-CM

## 2021-04-18 DIAGNOSIS — M25562 Pain in left knee: Secondary | ICD-10-CM

## 2021-04-18 DIAGNOSIS — R2689 Other abnormalities of gait and mobility: Secondary | ICD-10-CM

## 2021-04-18 DIAGNOSIS — G8929 Other chronic pain: Secondary | ICD-10-CM

## 2021-04-18 DIAGNOSIS — M6281 Muscle weakness (generalized): Secondary | ICD-10-CM

## 2021-04-18 NOTE — Patient Instructions (Signed)
Pt instructed to progress his HEP accordingly by resistance.

## 2021-04-18 NOTE — Therapy (Signed)
[facility] Outpatient Rehabilitation [facility] [address] [place], [place], [address] Phone: [phone]   Fax:  [phone]  Physical Therapy Treatment  Patient Details  Name: Phillippe Orlick MRN: [id number] Date of Birth: [birth year] Referring Provider (PT): gregory scott dean MD   Encounter Date: 04/18/2021   PT End of Session - 04/18/21 1445     Visit Number 3    Number of Visits 17    Date for PT Re-Evaluation 05/30/21    Authorization Type U[employer] MCD    PT Start Time 1400    PT Stop Time 1445    PT Time Calculation (min) 45 min    Activity Tolerance Patient tolerated treatment well    Behavior During Therapy [facility] for tasks assessed/performed             History reviewed. No pertinent past medical history.  Past Surgical History:  Procedure Laterality Date   ANTERIOR CRUCIATE LIGAMENT REPAIR Left 02/23/2021   Procedure: LEFT KNEE  ANTERIOR CRUCIATE LIGAMENT (ACL) RECONSTRUCION WITH QUAD AUTOGRAFT, REPAIR OF MEDIAL COLLATERAL LIGAMENT AND  MEDIAL AND LATERAL MENISCAL REPAIR vs DEBRIDEMENT;  Surgeon: Cammy Copa, MD;  Location: [facility] OR;  Service: Orthopedics;  Laterality: Left;   WISDOM TOOTH EXTRACTION      There were no vitals filed for this visit.   Subjective Assessment - 04/18/21 1400     Subjective Pt reports some residual soreness from last visit, but he denies any pain since that time. He states he has been adherent to his HEP, except for the chair stretch due to difficulty getting in that position.    Pain Score 0-No pain                               [facility] Adult PT Treatment/Exercise - 04/18/21 0001       Blood Flow Restriction   Blood Flow Restriction Yes      Blood Flow Restriction-Positions    Blood Flow Restriction Position Sitting      BFR Sitting   Sitting Limb Occulsion Pressure (mmHg) 190    Sitting Exercise Pressure (mmHg) 133    Sitting Exercise Prescription 30,15,15,15, reps w/ 30-60 sec rest     Sitting Exercise Prescription Comment LAQ with 4# ankle weight      Knee/Hip Exercises: Standing   Forward Lunges Other (comment)   Walking lunges with 6# DB's 3x32ft   Wall Squat Other (comment)   3x30sec wall sit   SLS with 2kg ball throws/ catches 3x10    Rebounder --      Knee/Hip Exercises: Prone   Hamstring Curl Other (comment)   2x10 with eccentric lowering, 5# ankles weights                   PT Education - 04/18/21 1437     Education Details Educated on effects of BFR, as well as proper form when performing previously prescribed HEP exercises.    Person(s) Educated Patient    Methods Explanation;Verbal cues;Demonstration    Comprehension Verbalized understanding;Returned demonstration;Verbal cues required              PT Short Term Goals - 04/04/21 1459       PT SHORT TERM GOAL #1   Title pt to be be IND with inital HEP    Baseline no previous HEP    Time 4    Period Weeks    Status New  Target Date 05/02/21      PT SHORT TERM GOAL #2   Title pt to exhibit reduction of L knee edema by >/= .5 cm to promote knee ROM    Baseline see flow sheet    Time 4    Period Weeks    Status New    Target Date 05/02/21      PT SHORT TERM GOAL #3   Title pt to demo efficent step througho pattern with heel strike/ toe off with limited to no antalgic pattern    Baseline no heel strike/ toe off, step to pattern    Time 4    Period Weeks    Status New    Target Date 05/02/21               PT Long Term Goals - 04/04/21 1559       PT LONG TERM GOAL #1   Title pt to increase L knee total arc ROM to >/= 3 - 125 degrees for functional knee ROM required for efficent gait pattern    Baseline see flowsheet    Time 8    Period Weeks    Status New    Target Date 05/30/21      PT LONG TERM GOAL #2   Title pt to increase LLE strenght to 5/5 to promote knee stability    Baseline unable to test MMT    Time 8    Period Weeks    Status New    Target Date  05/30/21      PT LONG TERM GOAL #3   Title increase LEFS to >/= 75/80 to demo improvement in function    Baseline 46/80    Time 8    Period Weeks    Status New    Target Date 05/30/21      PT LONG TERM GOAL #4   Title pt to be able perofrm dynamic / plyometric utilzing double/ single LE with no report of pain or limtations for return to sport and pt goals.    Baseline unable to perform dynamic / plyometric activities    Time 8    Period Weeks    Status New    Target Date 05/30/21      PT LONG TERM GOAL #5   Title pt to be IND with advanced HEP and is able to maintain and progress current LOF  IND    Baseline no previous HEP    Time 8    Period Weeks    Status New    Target Date 05/30/21                   Plan - 04/18/21 1445     Clinical Impression Statement Pt is 9 weeks, 6 days s/p L knee ACL autograft w/ MCL repair and medial and lateral meniscal repair on 02/23/2021. Pt responded well to all treatment today with proper form and no increase in pain with any of the selected exercises. He reports increased fatigue at the end of the session but no increase in pain.  He will continue to benefit from skilled PT to address his primary impairments and return to his prior level of function without limitation. Will plan to progress to plyometric and agility exercises at next visit while utilizing [facility] ACL reconstruction protocol during POC.    Stability/Clinical Decision Making Stable/Uncomplicated    Clinical Decision Making Low    Rehab Potential Good    PT Frequency  2x / week    PT Duration 8 weeks    PT Treatment/Interventions ADLs/Self Care Home Management;Electrical Stimulation;Cryotherapy;Iontophoresis 4mg /ml Dexamethasone;Moist Heat;Ultrasound;Therapeutic exercise;Therapeutic activities;Balance training;Gait training;Neuromuscular re-education;Patient/family education;Stair training;Manual techniques;Passive range of motion;Dry  needling;Taping;Vasopneumatic Device    PT Next Visit Plan Progress to agility/ plyometric exercises in accordance to [facility] ACL rehab protocol.    PT Home Exercise Plan [facility]- quad set, SLR, heel side with strap, standing heel raise, sidelying hip abduction    Consulted and Agree with Plan of Care Patient             Patient will benefit from skilled therapeutic intervention in order to improve the following deficits and impairments:  Improper body mechanics, Increased muscle spasms, Decreased strength, Pain, Postural dysfunction, Abnormal gait, Decreased activity tolerance, Decreased endurance, Increased edema  Visit Diagnosis: Chronic pain of left knee  Localized edema  Muscle weakness (generalized)  Other abnormalities of gait and mobility     Problem List Patient Active Problem List   Diagnosis Date Noted   Left anterior cruciate ligament tear    Acute medial meniscus tear of left knee    Acute lateral meniscus tear of left knee    Complete tear of MCL of knee, left, initial encounter     [facility], PT, DPT 04/18/21 2:48 PM   [facility] Outpatient Rehabilitation [facility] [address] [place], [place], [place] Phone: [phone]   Fax:  [phone]  Name: Kyley Laurel MRN: Binnie Kand Date of Birth: 05/21/2001

## 2021-04-19 ENCOUNTER — Encounter: Payer: Medicaid Other | Admitting: Orthopedic Surgery

## 2021-04-21 ENCOUNTER — Encounter: Payer: Self-pay | Admitting: Physical Therapy

## 2021-04-21 ENCOUNTER — Other Ambulatory Visit: Payer: Self-pay

## 2021-04-21 ENCOUNTER — Ambulatory Visit: Payer: Medicaid Other | Admitting: Physical Therapy

## 2021-04-21 DIAGNOSIS — M6281 Muscle weakness (generalized): Secondary | ICD-10-CM

## 2021-04-21 DIAGNOSIS — R2689 Other abnormalities of gait and mobility: Secondary | ICD-10-CM

## 2021-04-21 DIAGNOSIS — M25562 Pain in left knee: Secondary | ICD-10-CM | POA: Diagnosis not present

## 2021-04-21 DIAGNOSIS — R600 Localized edema: Secondary | ICD-10-CM

## 2021-04-21 DIAGNOSIS — G8929 Other chronic pain: Secondary | ICD-10-CM

## 2021-04-21 NOTE — Therapy (Addendum)
[place], [place], [address] Phone: [phone]   Fax:  [phone]  Physical Therapy Treatment/ Discharge Summary  Patient Details  Name: Nathaniel Matthews MRN: [id number] Date of Birth: 07-20-2001 Referring Provider (PT): gregory scott dean MD   Encounter Date: 04/21/2021   PT End of Session - 04/21/21 1311     Visit Number 4    Number of Visits 17    Date for PT Re-Evaluation 05/30/21    Authorization Type UHC MCD    PT Start Time 5638    PT Stop Time 1315    PT Time Calculation (min) 45 min    Activity Tolerance Patient tolerated treatment well    Behavior During Therapy [facility] for tasks assessed/performed             History reviewed. No pertinent past medical history.  Past Surgical History:  Procedure Laterality Date   ANTERIOR CRUCIATE LIGAMENT REPAIR Left 02/23/2021   Procedure: LEFT KNEE  ANTERIOR CRUCIATE LIGAMENT (ACL) RECONSTRUCION WITH QUAD AUTOGRAFT, REPAIR OF MEDIAL COLLATERAL LIGAMENT AND  MEDIAL AND LATERAL MENISCAL REPAIR vs DEBRIDEMENT;  Surgeon: Meredith Pel, MD;  Location: [place];  Service: Orthopedics;  Laterality: Left;   WISDOM TOOTH EXTRACTION      There were no vitals filed for this visit.   Subjective Assessment - 04/21/21 1235     Subjective "doing pretty good, no pain. Still get some occurrence that knee wants to buckle every now in a while."                [facility] PT Assessment - 04/21/21 0001       Assessment   Medical Diagnosis Status post reconstruction of anterior cruciate ligament (V56.433)    Referring Provider (PT) gregory scott dean MD                           [facility] Adult PT Treatment/Exercise - 04/21/21 0001       Blood Flow Restriction-Positions    Blood Flow Restriction Position Sitting;Standing      BFR Sitting   Sitting Limb Occulsion Pressure (mmHg) 180   70%   Sitting Exercise Pressure (mmHg) 126    Sitting  Exercise Prescription 30,15,15,15, reps w/ 30-60 sec rest    Sitting Exercise Prescription Comment LAQ with 4# ankle weight      BFR Standing   Standing Limb Occulsion Pressure (mmHg) 260   70%   Standing Exercise Pressure (mmHg) 182    Standing Exercise Prescription 30,15,15,15, reps w/ 30-60 sec rest    Standing Exercise Prescription Comment sit to stand from table   with RLE slightly advanced     Knee/Hip Exercises: Aerobic   Elliptical L1 x 5 min Ramp L1      Knee/Hip Exercises: Standing   Walking with Sports Cord forward 6 x 50 ft, forward/ backward in // 6   pt had increased challeneged so worked on forward/ backward rocking as a pregait activity.                     PT Short Term Goals - 04/04/21 1459       PT SHORT TERM GOAL #1   Title pt to be be IND with inital HEP    Baseline no previous HEP    Time 4    Period Weeks    Status New    Target Date 05/02/21  PT SHORT TERM GOAL #2   Title pt to exhibit reduction of L knee edema by >/= .5 cm to promote knee ROM    Baseline see flow sheet    Time 4    Period Weeks    Status New    Target Date 05/02/21      PT SHORT TERM GOAL #3   Title pt to demo efficent step througho pattern with heel strike/ toe off with limited to no antalgic pattern    Baseline no heel strike/ toe off, step to pattern    Time 4    Period Weeks    Status New    Target Date 05/02/21               PT Long Term Goals - 04/04/21 1559       PT LONG TERM GOAL #1   Title pt to increase L knee total arc ROM to >/= 3 - 125 degrees for functional knee ROM required for efficent gait pattern    Baseline see flowsheet    Time 8    Period Weeks    Status New    Target Date 05/30/21      PT LONG TERM GOAL #2   Title pt to increase LLE strenght to 5/5 to promote knee stability    Baseline unable to test MMT    Time 8    Period Weeks    Status New    Target Date 05/30/21      PT LONG TERM GOAL #3   Title increase LEFS to  >/= 75/80 to demo improvement in function    Baseline 46/80    Time 8    Period Weeks    Status New    Target Date 05/30/21      PT LONG TERM GOAL #4   Title pt to be able perofrm dynamic / plyometric utilzing double/ single LE with no report of pain or limtations for return to sport and pt goals.    Baseline unable to perform dynamic / plyometric activities    Time 8    Period Weeks    Status New    Target Date 05/30/21      PT LONG TERM GOAL #5   Title pt to be IND with advanced HEP and is able to maintain and progress current LOF  IND    Baseline no previous HEP    Time 8    Period Weeks    Status New    Target Date 05/30/21                   Plan - 04/21/21 1303     Clinical Impression Statement Nathaniel Matthews reports no pain but does have some occurence of buckling in the L leg. worked on gait training which he required frequent cues and demonstration for proper form before ultimately required standing rocking as a pre-gait activity. continued working on quad strength with BFR with LAQ , and sit to standusing BFR which he did well with.    PT Treatment/Interventions ADLs/Self Care Home Management;Electrical Stimulation;Cryotherapy;Iontophoresis 4mg /ml Dexamethasone;Moist Heat;Ultrasound;Therapeutic exercise;Therapeutic activities;Balance training;Gait training;Neuromuscular re-education;Patient/family education;Stair training;Manual techniques;Passive range of motion;Dry needling;Taping;Vasopneumatic Device    PT Next Visit Plan Progress to agility/ plyometric exercises in accordance to [facility] ACL rehab protocol. review gait training. progress hamstring strengthening.    PT Home Exercise Plan [facility]- quad set, SLR, heel side with strap, standing heel raise, sidelying hip abduction    Consulted and  Agree with Plan of Care Patient             Patient will benefit from skilled therapeutic intervention in order to improve the following deficits  and impairments:  Improper body mechanics, Increased muscle spasms, Decreased strength, Pain, Postural dysfunction, Abnormal gait, Decreased activity tolerance, Decreased endurance, Increased edema  Visit Diagnosis: Chronic pain of left knee  Localized edema  Muscle weakness (generalized)  Other abnormalities of gait and mobility     Problem List Patient Active Problem List   Diagnosis Date Noted   Left anterior cruciate ligament tear    Acute medial meniscus tear of left knee    Acute lateral meniscus tear of left knee    Complete tear of MCL of knee, left, initial encounter    Starr [place] PT, DPT, LAT, ATC  04/21/21  1:16 PM     [facility] [address] [place], [place], [address] Phone: [phone]   Fax:  [phone]  Name: Nathaniel Matthews MRN: [id number] Date of Birth: 2001/05/10  PHYSICAL THERAPY DISCHARGE SUMMARY  Visits from Start of Care: 4  Current functional level related to goals / functional outcomes: Unable to assess   Remaining deficits: Unable to assess   Education / Equipment: HEP   Patient agrees to discharge. Patient goals were not met. Patient is being discharged due to not returning since the last visit.  Vanessa [place], PT, DPT 05/15/21 11:00 AM

## 2021-04-24 ENCOUNTER — Ambulatory Visit: Payer: Medicaid Other | Admitting: Physical Therapy

## 2021-04-27 ENCOUNTER — Ambulatory Visit: Payer: Medicaid Other

## 2021-04-27 ENCOUNTER — Telehealth: Payer: Self-pay

## 2021-04-27 NOTE — Telephone Encounter (Signed)
Pt did not answer his cell phone and had no voicemail set-up. Called pt's father, who reports that the pt has moved out of the house the past few days and that he nor the pt's mother have been able to get in contact with him. He took a message to relay to the pt when he speaks with him again regarding the clinic's no show policy. The pt's remaining scheduled visit was relayed to the pt's father and he reports understanding that the pt will be discharged upon his 3rd no-show.

## 2021-05-01 ENCOUNTER — Telehealth: Payer: Self-pay

## 2021-05-01 ENCOUNTER — Ambulatory Visit: Payer: Medicaid Other

## 2021-05-01 NOTE — Telephone Encounter (Signed)
Spoke with pt regarding his 3rd no-show and instructed pt that he was discharged from PT at this time in accordance to the attendance policy. He was instructed to receive a new referral from his referring provider in order to be seen further at [facility]. He reports understanding of this.

## 2021-05-24 ENCOUNTER — Encounter: Payer: Medicaid Other | Admitting: Orthopedic Surgery

## 2021-12-28 ENCOUNTER — Other Ambulatory Visit: Payer: Self-pay

## 2021-12-28 ENCOUNTER — Encounter (HOSPITAL_COMMUNITY): Payer: Self-pay | Admitting: Emergency Medicine

## 2021-12-28 ENCOUNTER — Emergency Department (HOSPITAL_COMMUNITY)
Admission: EM | Admit: 2021-12-28 | Discharge: 2021-12-28 | Disposition: A | Discharge status: Home / Self Care | Payer: Medicaid Other | Attending: Emergency Medicine | Admitting: Emergency Medicine

## 2021-12-28 DIAGNOSIS — B085 Enteroviral vesicular pharyngitis: Secondary | ICD-10-CM | POA: Insufficient documentation

## 2021-12-28 DIAGNOSIS — J029 Acute pharyngitis, unspecified: Secondary | ICD-10-CM | POA: Diagnosis present

## 2021-12-28 LAB — GROUP A STREP BY PCR: Group A Strep by PCR: NOT DETECTED

## 2021-12-28 MED ORDER — LIDOCAINE VISCOUS HCL 2 % MT SOLN
15.0000 mL | Freq: Once | OROMUCOSAL | Status: AC
  Administered 2021-12-28: 15 mL via OROMUCOSAL
  Filled 2021-12-28: qty 15

## 2021-12-28 MED ORDER — IBUPROFEN 200 MG PO TABS
600.0000 mg | ORAL_TABLET | Freq: Once | ORAL | Status: AC
  Administered 2021-12-28: 600 mg via ORAL
  Filled 2021-12-28: qty 3

## 2021-12-28 MED ORDER — LIDOCAINE VISCOUS HCL 2 % MT SOLN: 15.0000 mL | OROMUCOSAL | 0 refills | Status: AC | PRN

## 2021-12-28 NOTE — ED Notes (Signed)
An After Visit Summary was printed and given to the patient. Discharge instructions given and no further questions at this time.  

## 2021-12-28 NOTE — ED Provider Notes (Signed)
?[facility]-EMERGENCY DEPT ?Provider Note ? ? ?CSN: [id number] ?Arrival date & time: 12/28/21  1846 ? ?  ? ?History ? ?Chief Complaint  ?Patient presents with  ? Sore Throat  ? ? ?Nathaniel Matthews is a 21 y.o. male. ? ?Nathaniel Matthews is a 21 y.o. male who is otherwise healthy, presents to the emergency department for evaluation of sore throat.  Symptoms started about 3 to 4 days ago.  Patient reports sore throat and pain with swallowing.  Has had some chills and is unsure if he has had a fever.  No cough or congestion.  No associated chest pain or shortness of breath.  No abdominal pain, nausea or vomiting.  No rashes noted.  No known sick contacts. ? ?The history is provided by the patient.  ?Sore Throat ?Pertinent negatives include no abdominal pain.  ? ?  ? ?Home Medications ?Prior to Admission medications   ?Medication Sig Start Date End Date Taking? Authorizing Provider  ?acetaminophen (TYLENOL) 500 MG tablet Take 1,000 mg by mouth every 6 (six) hours as needed for moderate pain.    [provider]  ?cephALEXin (KEFLEX) 500 MG capsule Take 2 capsules (1,000 mg total) by mouth 2 (two) times daily. Take one dose (2 capsules) tonight and one dose tomorrow morning after waking up 02/23/21   Magnant, Charles L, PA-C  ?ibuprofen (ADVIL) 200 MG tablet Take 800 mg by mouth every 8 (eight) hours as needed for moderate pain.    [provider]  ?methocarbamol (ROBAXIN) 500 MG tablet Take 1 tablet (500 mg total) by mouth every 8 (eight) hours as needed. 02/23/21   Magnant, Joycie Peek, PA-C  ?oxyCODONE-acetaminophen (PERCOCET) 5-325 MG tablet Take 1 tablet by mouth every 4 (four) hours as needed for severe pain. 02/23/21 02/23/22  Magnant, Joycie Peek, PA-C  ?lisdexamfetamine (VYVANSE) 20 MG capsule Take 20 mg by mouth every morning.  12/26/19  [provider]  ?   ? ?Allergies    ?Patient has no known allergies.   ? ?Review of Systems   ?Review of Systems  ?Constitutional:  Negative for  chills and fever.  ?HENT:  Positive for sore throat. Negative for congestion and rhinorrhea.   ?Respiratory:  Negative for cough.   ?Gastrointestinal:  Negative for abdominal pain, diarrhea and vomiting.  ?Skin:  Negative for rash.  ? ?Physical Exam ?Updated Vital Signs ?BP 131/88   Pulse (!) 115   Temp 98.7 ?F (37.1 ?C) (Oral)   Resp 15   SpO2 96%  ?Physical Exam ?Vitals and nursing note reviewed.  ?Constitutional:   ?   General: He is not in acute distress. ?   Appearance: Normal appearance. He is well-developed. He is not ill-appearing or diaphoretic.  ?HENT:  ?   Head: Normocephalic and atraumatic.  ?   Right Ear: Tympanic membrane and ear canal normal.  ?   Left Ear: Tympanic membrane and ear canal normal.  ?   Nose: No congestion or rhinorrhea.  ?   Mouth/Throat:  ?   Tonsils: No tonsillar exudate or tonsillar abscesses.  ?   Comments: Posterior oropharynx erythematous with several small white lesions over the oropharynx with surrounding redness, mild swelling of the tonsils but without exudates, uvula midline, tolerating secretions, normal phonation ?Eyes:  ?   General:     ?   Right eye: No discharge.     ?   Left eye: No discharge.  ?Cardiovascular:  ?   Rate and Rhythm: Normal rate and  regular rhythm.  ?Pulmonary:  ?   Effort: Pulmonary effort is normal. No respiratory distress.  ?   Breath sounds: Normal breath sounds.  ?Abdominal:  ?   General: Bowel sounds are normal.  ?   Tenderness: There is no abdominal tenderness.  ?Lymphadenopathy:  ?   Cervical: No cervical adenopathy.  ?Skin: ?   General: Skin is warm and dry.  ?   Comments: No rashes or skin changes noted, no lesions on the hands or feet.  ?Neurological:  ?   Mental Status: He is alert and oriented to person, place, and time.  ?   Coordination: Coordination normal.  ?Psychiatric:     ?   Mood and Affect: Mood normal.     ?   Behavior: Behavior normal.  ? ? ?ED Results / Procedures / Treatments   ?Labs ?(all labs ordered are listed, but only  abnormal results are displayed) ?Labs Reviewed  ?GROUP A STREP BY PCR  ? ? ?EKG ?None ? ?Radiology ?No results found. ? ?Procedures ?Procedures  ? ? ?Medications Ordered in ED ?Medications - No data to display ? ?ED Course/ Medical Decision Making/ A&P ?  ?                        ?Medical Decision Making ?Risk ?OTC drugs. ?Prescription drug management. ? ? ? 21 y.o. male presents to the ED with complaints of sore throat, this involves an extensive number of treatment options, and is a complaint that carries with it a high risk of complications and morbidity.  The differential diagnosis includes strep pharyngitis, viral pharyngitis, hand-foot-and-mouth, herpangina, mono, PTA, RPA (Ddx) ? ?On arrival pt is nontoxic, vitals initially significant for tachycardia but this resolved without treatment, afebrile. Exam significant for white lesions with surrounding redness across the posterior oropharynx, no similar lesions noted elsewhere, this is most concerning for herpangina ? ? ? ?I ordered medication ibuprofen and viscous lidocaine for sore throat ? ?Lab Tests:  ?I Ordered, reviewed, and interpreted labs, which included: Group A strep PCR negative ? ?ED Course:  ? ?Patient strep test is negative and the lesions on the posterior oropharynx concerning for herpangina, no lesions on the palms or soles.  Discussed with patient that this is caused by a virus and is typically self resolved but discussed using ibuprofen and Tylenol as well as prescribed viscous lidocaine as needed for pain relief and stressed the importance of drinking plenty of fluids and remaining well-hydrated.  Encouraged to follow-up with primary care provider if symptoms not improving.  Return precautions discussed.  Discharged home in good condition. ? ? ?Portions of this note were generated with Scientist, clinical (histocompatibility and immunogenetics). Dictation errors may occur despite best attempts at proofreading. ? ? ? ? ? ? ? ? ? ?Final Clinical Impression(s) / ED  Diagnoses ?Final diagnoses:  ?Herpangina  ? ? ?Rx / DC Orders ?ED Discharge Orders   ? ?      Ordered  ?  lidocaine (XYLOCAINE) 2 % solution  Every 4 hours PRN       ? 12/28/21 2133  ? ?  ?  ? ?  ? ? ?  ?Dartha Lodge, PA-C ?01/07/22 1311 ? ?  ?Alvira Monday, MD ?01/07/22 2307 ? ?

## 2021-12-28 NOTE — ED Triage Notes (Signed)
Pt reports sore throat and swollen tonsils x a few days.  ?

## 2021-12-28 NOTE — Discharge Instructions (Addendum)
I suspect your sore throat is being caused by a virus that often causes hand-foot-and-mouth disease which is more common in children, but in adults they more commonly experience only the sores in the mouth and throat and this is called herpangina.  Your strep test today was negative.  This virus typically just has to run its course but you can use ibuprofen and Tylenol as well as prescribed viscous lidocaine to help with pain.  Make sure you are drinking plenty of fluids and staying hydrated.  Follow-up with your primary care provider if this is not resolving.  Return if you develop fevers, you are unable to swallow, have any difficulty breathing or other new or concerning symptoms. ?

## 2022-05-22 ENCOUNTER — Emergency Department (HOSPITAL_COMMUNITY)
Admission: EM | Admit: 2022-05-22 | Discharge: 2022-05-22 | Disposition: A | Discharge status: Home / Self Care | Payer: Medicaid Other | Attending: Emergency Medicine | Admitting: Emergency Medicine

## 2022-05-22 ENCOUNTER — Emergency Department (HOSPITAL_COMMUNITY): Payer: Medicaid Other

## 2022-05-22 ENCOUNTER — Encounter (HOSPITAL_COMMUNITY): Payer: Self-pay | Admitting: Emergency Medicine

## 2022-05-22 DIAGNOSIS — X503XXA Overexertion from repetitive movements, initial encounter: Secondary | ICD-10-CM | POA: Diagnosis not present

## 2022-05-22 DIAGNOSIS — Y9302 Activity, running: Secondary | ICD-10-CM | POA: Diagnosis not present

## 2022-05-22 DIAGNOSIS — S93401A Sprain of unspecified ligament of right ankle, initial encounter: Secondary | ICD-10-CM | POA: Insufficient documentation

## 2022-05-22 DIAGNOSIS — S99911A Unspecified injury of right ankle, initial encounter: Secondary | ICD-10-CM | POA: Diagnosis present

## 2022-05-22 MED ORDER — MELOXICAM 7.5 MG PO TABS: 7.5000 mg | ORAL_TABLET | Freq: Every day | ORAL | 0 refills | Status: AC

## 2022-05-22 MED ORDER — IBUPROFEN 800 MG PO TABS
800.0000 mg | ORAL_TABLET | Freq: Once | ORAL | Status: AC
  Administered 2022-05-22: 800 mg via ORAL
  Filled 2022-05-22: qty 1

## 2022-05-22 NOTE — ED Provider Notes (Signed)
[facility] DEPT Provider Note   CSN: [id number] Arrival date & time: 05/22/22  0138     History  Chief Complaint  Patient presents with   Foot Injury    Nathaniel Matthews is a 22 y.o. male.  Patient with no pertinent past medical history presents today with complaints of right ankle pain. He states that same began on Saturday when he was running and felt a pop followed by pain in his right ankle. He has been able to walk since but states he has to walk around on his toes and is significantly uncomfortable. He has been icing and taking OTC meds with some relief.  The history is provided by the patient. No language interpreter was used.  Foot Injury      Home Medications Prior to Admission medications   Medication Sig Start Date End Date Taking? Authorizing Provider  acetaminophen (TYLENOL) 500 MG tablet Take 1,000 mg by mouth every 6 (six) hours as needed for moderate pain.    [provider]  cephALEXin (KEFLEX) 500 MG capsule Take 2 capsules (1,000 mg total) by mouth 2 (two) times daily. Take one dose (2 capsules) tonight and one dose tomorrow morning after waking up 02/23/21   Magnant, Charles L, PA-C  ibuprofen (ADVIL) 200 MG tablet Take 800 mg by mouth every 8 (eight) hours as needed for moderate pain.    [provider]  methocarbamol (ROBAXIN) 500 MG tablet Take 1 tablet (500 mg total) by mouth every 8 (eight) hours as needed. 02/23/21   Magnant, Charles L, PA-C  lisdexamfetamine (VYVANSE) 20 MG capsule Take 20 mg by mouth every morning.  12/26/19  [provider]      Allergies    Patient has no known allergies.    Review of Systems   Review of Systems  Musculoskeletal:  Positive for arthralgias.  All other systems reviewed and are negative.   Physical Exam Updated Vital Signs BP (!) 142/99 (BP Location: Left Arm)   Pulse (!) 110   Temp 97.7 F (36.5 C) (Oral)   Resp 18   Ht 6\' 4"  (1.93 m)   Wt 117.9 kg    SpO2 100%   BMI 31.65 kg/m  Physical Exam Vitals and nursing note reviewed.  Constitutional:      General: He is not in acute distress.    Appearance: Normal appearance. He is normal weight. He is not ill-appearing, toxic-appearing or diaphoretic.  HENT:     Head: Normocephalic and atraumatic.  Cardiovascular:     Rate and Rhythm: Normal rate.  Pulmonary:     Effort: Pulmonary effort is normal. No respiratory distress.  Musculoskeletal:        General: Normal range of motion.     Cervical back: Normal range of motion.     Comments: Swelling and tenderness to palpation over the right medial malleolus of the ankle. No erythema or warmth. DP and PT pulses intact and 2+. Capillary refill less than 2 seconds. ROM intact with some pain. Negative Thompson's test   Skin:    General: Skin is warm and dry.  Neurological:     General: No focal deficit present.     Mental Status: He is alert.  Psychiatric:        Mood and Affect: Mood normal.        Behavior: Behavior normal.     ED Results / Procedures / Treatments   Labs (all labs ordered are listed, but only abnormal results  are displayed) Labs Reviewed - No data to display  EKG None  Radiology DG Ankle Complete Right  Result Date: 05/22/2022 CLINICAL DATA:  Twisting injury with ankle pain, initial encounter EXAM: RIGHT ANKLE - COMPLETE 3+ VIEW COMPARISON:  None Available. FINDINGS: Soft tissue swelling is noted about the ankle. No acute fracture or dislocation is noted. IMPRESSION: Soft tissue swelling without acute bony abnormality. Electronically Signed   By: Alcide Clever M.D.   On: 05/22/2022 02:09    Procedures Procedures    Medications Ordered in ED Medications  ibuprofen (ADVIL) tablet 800 mg (has no administration in time range)    ED Course/ Medical Decision Making/ A&P                           Medical Decision Making Amount and/or Complexity of Data Reviewed Radiology: ordered.  Risk Prescription drug  management.   Patient presents today with right ankle pain after an injury 2 days ago.  He is afebrile, nontoxic-appearing, and in no acute distress with reassuring vital signs.  X-ray imaging obtained which was unremarkable for acute bony abnormality, did show soft tissue swelling.  Physical exam does  reveal soft tissue swelling to the ankle.  No erythema or warmth.  No concern for septic arthritis or other emergent concerns.  Achilles tendon is patent. Suspect that his symptoms are related to an ankle sprain.  Given a walking boot and crutches for support.  We will also give referral to orthopedics for continued evaluation and management.  Given prescription for an anti-inflammatory.  Patient is stable for discharge at this time, educated on red flag symptoms that would prompt immediate return.  Discharged in stable condition.   Final Clinical Impression(s) / ED Diagnoses Final diagnoses:  Sprain of right ankle, unspecified ligament, initial encounter    Rx / DC Orders ED Discharge Orders          Ordered    meloxicam (MOBIC) 7.5 MG tablet  Daily        05/22/22 0401          An After Visit Summary was printed and given to the patient.     Vear Clock 05/22/22 0401    Pollyann Savoy, MD 05/22/22 [phone]

## 2022-05-22 NOTE — ED Triage Notes (Signed)
Pt was running and heard his foot crack on Saturday. He has been icing and taking OTC meds for it. Is able to hop around on it bearing some weight. Pain is in the mostly in ankle and there is visible swelling that he states has improved.

## 2022-05-22 NOTE — ED Notes (Signed)
Patient transported to X-ray 

## 2022-05-22 NOTE — Discharge Instructions (Addendum)
As we discussed, x-ray imaging of her ankle was negative for fracture or dislocation.  I have given you a walking boot and crutches for use as needed for support of your injury.  I will also given you referral to orthopedics with a number to call for continued evaluation and management if your symptoms persist over the next few days.  I have also given you a prescription for an anti-inflammatory medication for you to take as prescribed as needed.  Do not take ibuprofen with this medication.  I also recommend that you rest, ice, compress, and elevate your ankle over the next few days to help with your pain.  Return if development of any new or worsening symptoms.

## 2023-05-05 IMAGING — MR MR KNEE*L* W/O CM
7 series · 40 of 40 positions shown · non-contrast
Comparison: None.

CLINICAL DATA: Knee instability

EXAM:
MRI OF THE LEFT KNEE WITHOUT CONTRAST
TECHNIQUE: Multiplanar, multisequence MR imaging of the knee was performed. No
intravenous contrast was administered.

[Series 6: T2 fat-sat · axial · left · 4.0mm · 0.53mm/px · z∈[-87,+67]mm · 5 of 36 slices shown (1 of 4)]
[im 1/36]
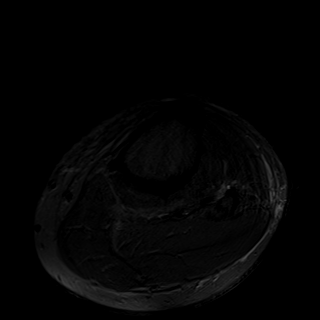
[im 9/36]
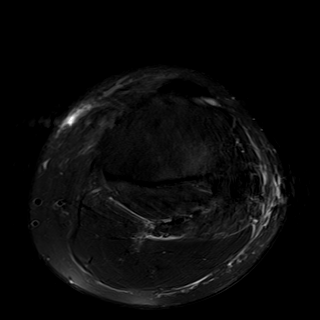
[im 18/36]
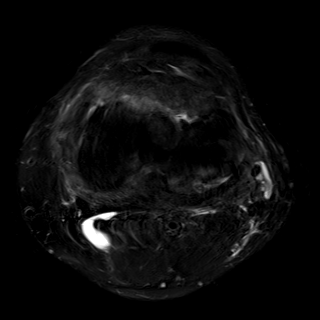
[im 27/36]
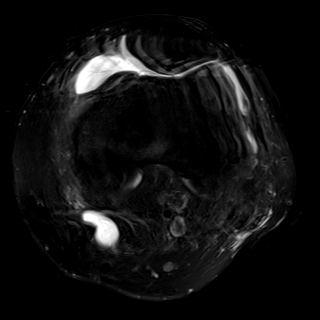
[im 36/36]
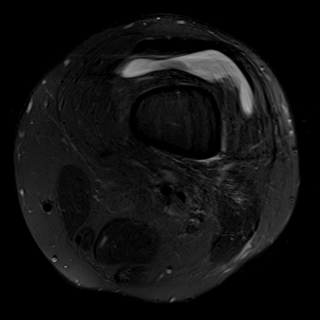

[Series 7: T2 fat-sat · coronal · left · 4.0mm · 0.47mm/px · 5 of 28 slices shown (2 of 4)]
[im 1/28]
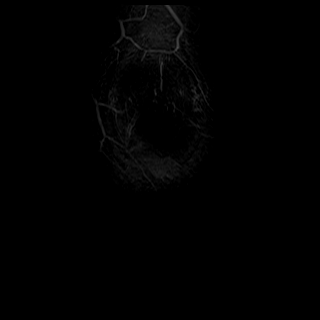
[im 7/28]
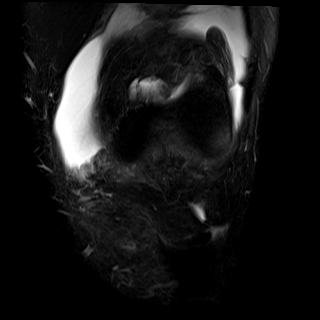
[im 14/28]
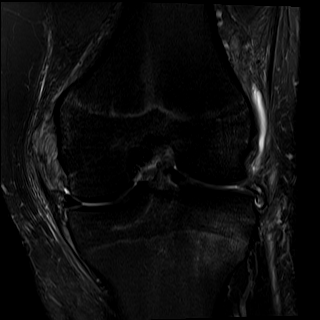
[im 21/28]
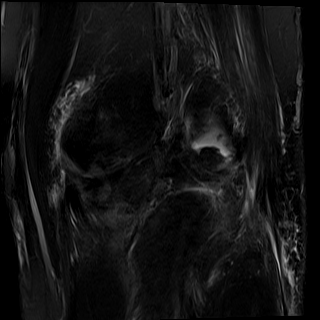
[im 28/28]
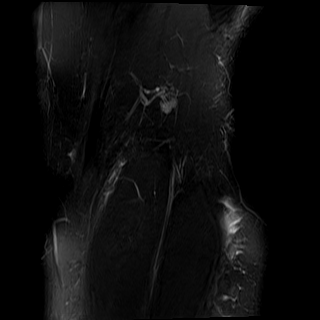

[Series 8: T1 · coronal · left · 4.0mm · 0.47mm/px · 5 of 28 slices shown]
[im 1/28]
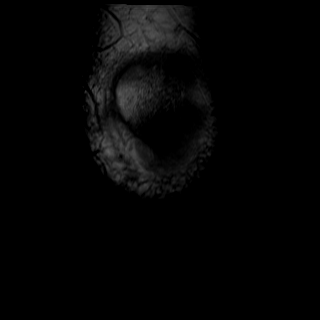
[im 7/28]
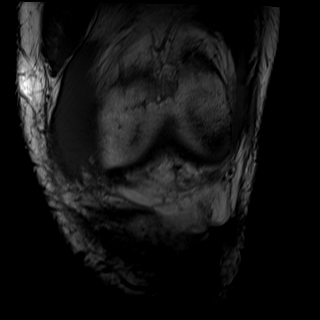
[im 14/28]
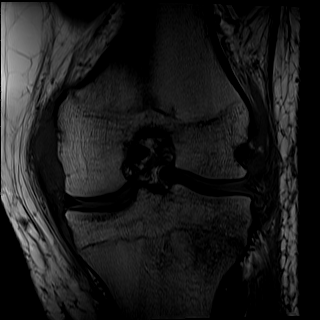
[im 21/28]
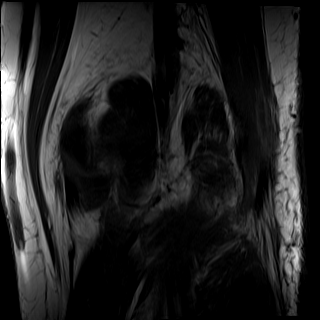
[im 28/28]
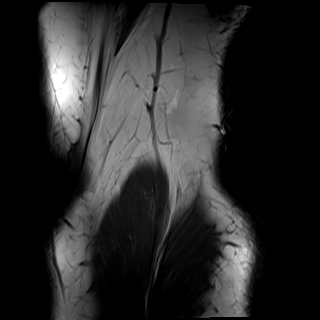

[Series 9: PD fat-sat · coronal · left · 3.0mm · 0.47mm/px · 7 of 38 slices shown (1 of 2)]
[im 1/38]
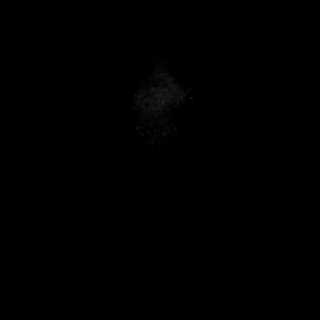
[im 7/38]
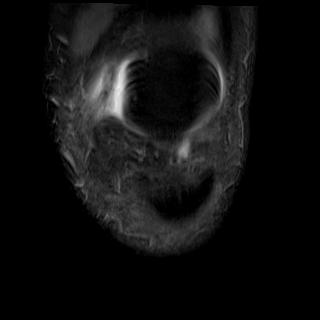
[im 13/38]
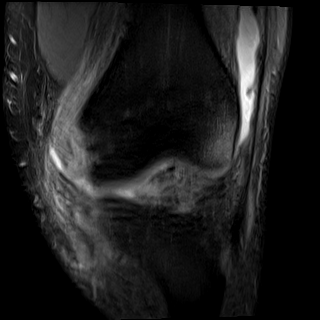
[im 19/38]
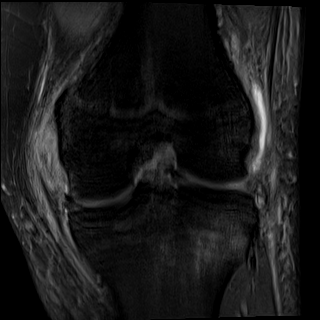
[im 25/38]
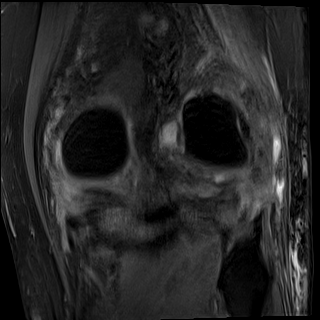
[im 31/38]
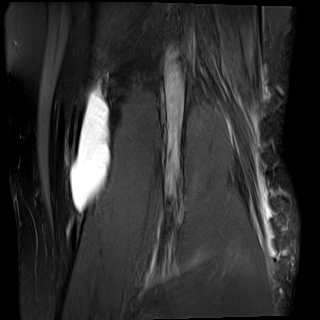
[im 38/38]
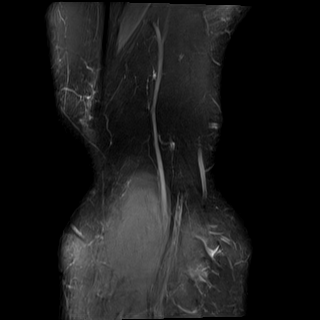

[Series 10: PD fat-sat · sagittal · left · 3.0mm · 0.42mm/px · 6 of 32 slices shown (2 of 2)]
[im 1/32]
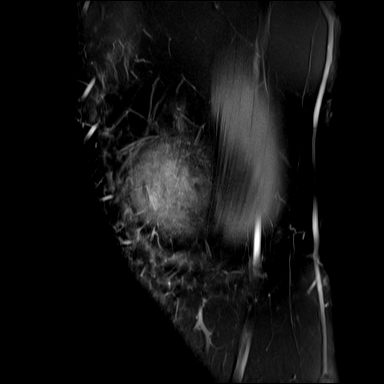
[im 7/32]
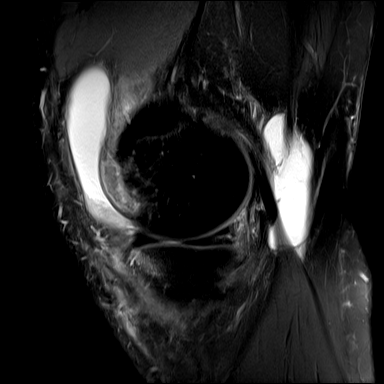
[im 13/32]
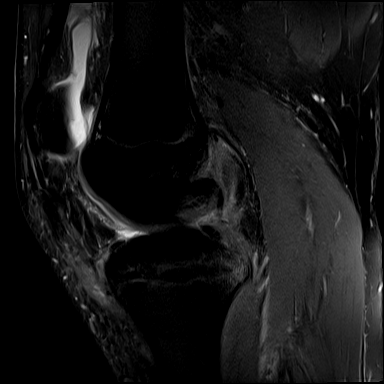
[im 19/32]
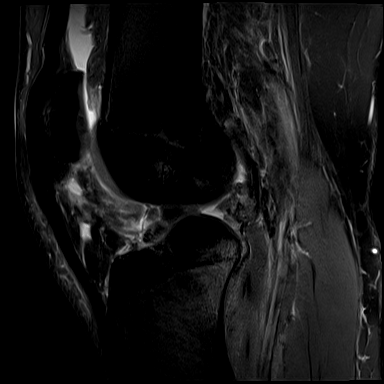
[im 25/32]
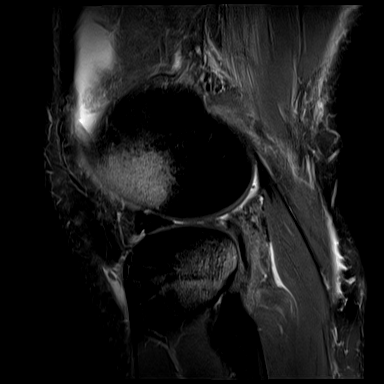
[im 32/32]
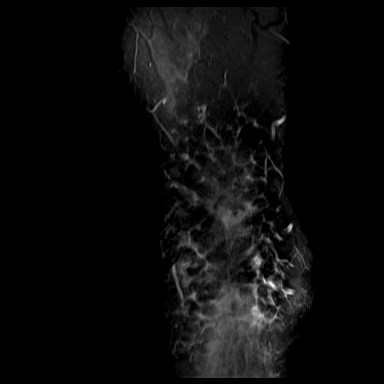

[Series 11: T2 fat-sat · sagittal · left · 3.0mm · 0.42mm/px · 6 of 32 slices shown (3 of 4)]
[im 1/32]
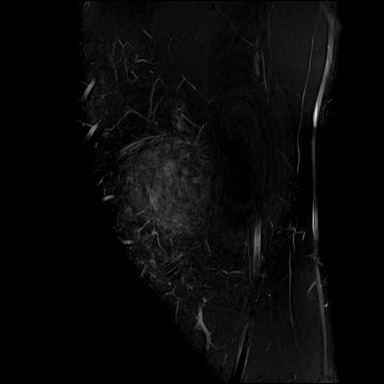
[im 7/32]
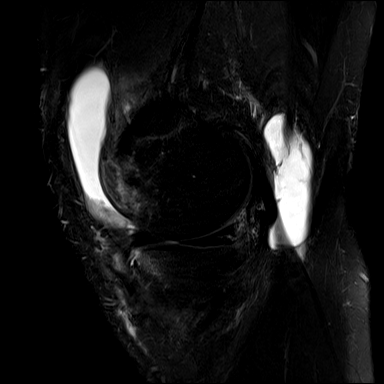
[im 13/32]
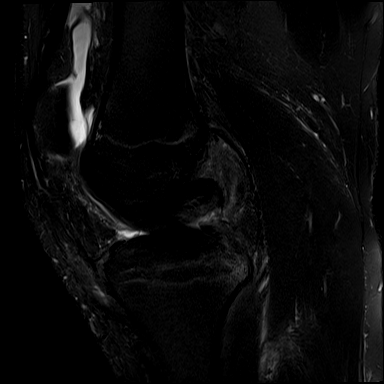
[im 19/32]
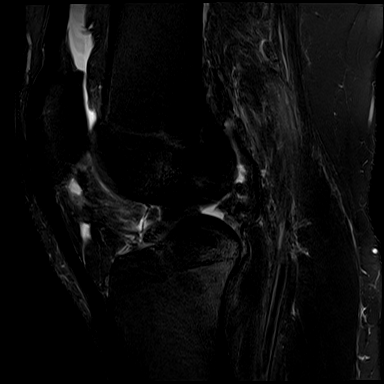
[im 25/32]
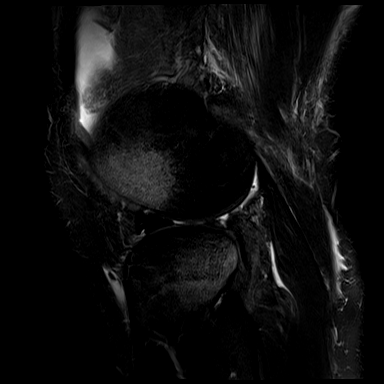
[im 32/32]
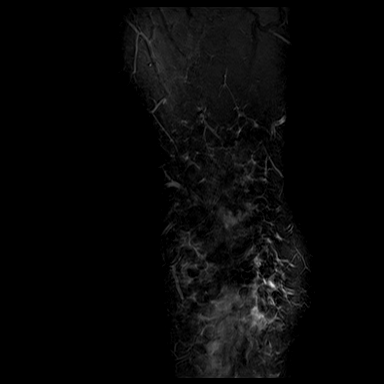

[Series 12: T2 fat-sat · axial · left · 4.0mm · 0.53mm/px · z∈[-87,+67]mm · 6 of 36 slices shown (4 of 4)]
[im 1/36]
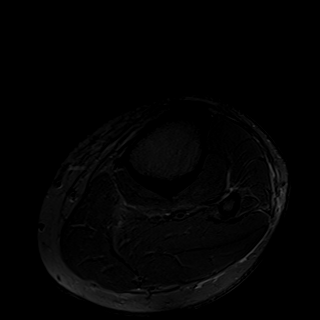
[im 8/36]
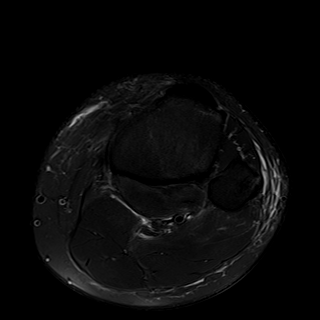
[im 15/36]
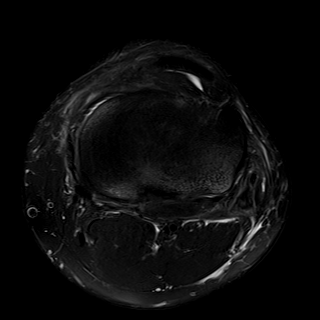
[im 22/36]
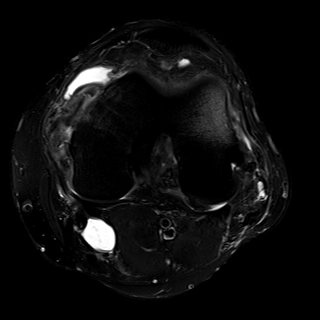
[im 29/36]
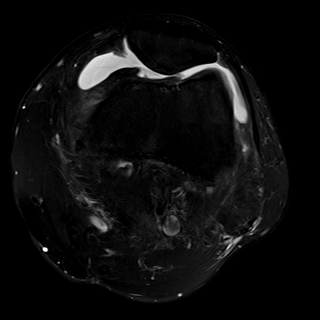
[im 36/36]
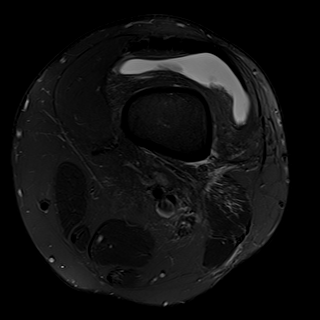

[40 of 40 positions shown; findings below may reference images not displayed]

FINDINGS: MENISCI

Medial meniscus: Nondisplaced horizontal tear of the posterior horn
medial meniscus. Possible posterior meniscocapsular separation.

Lateral meniscus: There is a displaced tear of the posterior
horn/body of the lateral meniscus with avulsed meniscal tissue
flipped anteriorly.

LIGAMENTS

Cruciates: Complete midsubstance ACL tear. The PCL is slightly lax
due to anterior translation of the tibia. The PCL is otherwise
intact.

Collaterals: Ruptured medial collateral ligament from the proximal
attachment on the femur. Extent adjacent edema. There is internal
signal within the lateral collateral ligament and adjacent edema.

CARTILAGE

Patellofemoral:  No focal defect.

Medial:  No focal defect.

Lateral:  No focal defect.

Joint:  Large joint effusion.

Popliteal Fossa:  Small Baker cyst.

Extensor Mechanism:  Intact quadriceps tendon and patellar tendon.

Bones: Pivot shift bony contusion pattern in the lateral femoral
condyle and lateral tibial plateau. Bony edema at the attachment
site of the MCL in the femur. Additional bony edema in the medial
tibial plateau both anteriorly and posteriorly.

Other: Soft tissue edema.
IMPRESSION: 1. Complete midsubstance ACL tear with severe pivot shift bony
contusion pattern. Grade 2 lateral collateral ligament injury.
Intact popliteus tendon and biceps femoris.
2. Ruptured medial collateral ligament from the proximal attachment
on the femur. Extensive adjacent soft tissue and bony edema in the
adjacent femoral condyle.
3. Displaced tear of the posterior horn/body of the lateral meniscus
with avulsed meniscal tissue flipped anteriorly.
4. Nondisplaced horizontal tear of the posterior horn medial
meniscus. Possible posterior medial meniscocapsular separation.
5. Large joint effusion.  Small Baker cyst.

## 2024-07-21 ENCOUNTER — Other Ambulatory Visit: Payer: Self-pay

## 2024-07-21 ENCOUNTER — Encounter (HOSPITAL_COMMUNITY): Payer: Self-pay

## 2024-07-21 ENCOUNTER — Emergency Department (HOSPITAL_COMMUNITY)

## 2024-07-21 ENCOUNTER — Emergency Department (HOSPITAL_COMMUNITY)
Admission: EM | Admit: 2024-07-21 | Discharge: 2024-07-21 | Disposition: A | Discharge status: Home / Self Care | Attending: Emergency Medicine | Admitting: Emergency Medicine

## 2024-07-21 DIAGNOSIS — J069 Acute upper respiratory infection, unspecified: Secondary | ICD-10-CM | POA: Diagnosis not present

## 2024-07-21 DIAGNOSIS — R059 Cough, unspecified: Secondary | ICD-10-CM | POA: Diagnosis present

## 2024-07-21 LAB — RESP PANEL BY RT-PCR (RSV, FLU A&B, COVID)  RVPGX2
Influenza A by PCR: NEGATIVE
Influenza B by PCR: NEGATIVE
Resp Syncytial Virus by PCR: NEGATIVE
SARS Coronavirus 2 by RT PCR: NEGATIVE

## 2024-07-21 MED ORDER — BENZONATATE 100 MG PO CAPS: 100.0000 mg | ORAL_CAPSULE | Freq: Three times a day (TID) | ORAL | 0 refills | Status: AC

## 2024-07-21 MED ORDER — CETIRIZINE HCL 10 MG PO TABS: 10.0000 mg | ORAL_TABLET | Freq: Every day | ORAL | 1 refills | Status: AC

## 2024-07-21 MED ORDER — FLUTICASONE PROPIONATE 50 MCG/ACT NA SUSP: 2.0000 | Freq: Every day | NASAL | 2 refills | Status: AC

## 2024-07-21 NOTE — ED Provider Triage Note (Signed)
 Emergency Medicine Provider Triage Evaluation Note  Nathaniel Matthews , a 23 y.o. male  was evaluated in triage.  Pt complains of persistent cough over the last 2 weeks, increased congestion, increased fatigue.  Purulent sputum..  Review of Systems  Positive: As above Negative:   Physical Exam  BP 136/89 (BP Location: Right Arm)   Pulse 78   Temp 98.1 F (36.7 C) (Oral)   Resp 15   Ht 6' 4 (1.93 m)   Wt 122.5 kg   SpO2 100%   BMI 32.87 kg/m  Gen:   Awake, no distress  Resp:  Normal effort  MSK:   Moves extremities without difficulty  Other:    Medical Decision Making  Medically screening exam initiated at 5:59 PM.  Appropriate orders placed.  Nathaniel Matthews was informed that the remainder of the evaluation will be completed by another provider, this initial triage assessment does not replace that evaluation, and the importance of remaining in the ED until their evaluation is complete.  Respiratory panel and chest x-ray ordered.   Nathaniel Matthews, Nathaniel Matthews 07/21/24 [phone]

## 2024-07-21 NOTE — ED Triage Notes (Signed)
 Cough with yellow mucus and congestion for 2 weeks

## 2024-07-21 NOTE — ED Provider Notes (Signed)
 [place] EMERGENCY DEPARTMENT AT [facility] Provider Note   CSN: [id number] Arrival date & time: 07/21/24  1738     Patient presents with: Cough   Nathaniel Matthews is a 23 y.o. male whose had an occasionally productive cough over the last 2 weeks with yellow mucus.  States that this not worsened but has not improved over time causing presented to the ED today for evaluation.  No previous medical history, does not take any medications at baseline.  Using over-the-counter cough and cold medications with minimal effect.    Cough      Prior to Admission medications   Medication Sig Start Date End Date Taking? Authorizing Provider  benzonatate (TESSALON) 100 MG capsule Take 1 capsule (100 mg total) by mouth every 8 (eight) hours. 07/21/24  Yes Myriam Dorn BROCKS, PA  cetirizine (ZYRTEC) 10 MG tablet Take 1 tablet (10 mg total) by mouth daily. 07/21/24  Yes Myriam Dorn BROCKS, PA  fluticasone (FLONASE) 50 MCG/ACT nasal spray Place 2 sprays into both nostrils daily. 07/21/24  Yes Myriam Dorn BROCKS, PA  acetaminophen  (TYLENOL ) 500 MG tablet Take 1,000 mg by mouth every 6 (six) hours as needed for moderate pain.    [provider]  cephALEXin  (KEFLEX ) 500 MG capsule Take 2 capsules (1,000 mg total) by mouth 2 (two) times daily. Take one dose (2 capsules) tonight and one dose tomorrow morning after waking up 02/23/21   Magnant, Charles L, PA-C  ibuprofen  (ADVIL ) 200 MG tablet Take 800 mg by mouth every 8 (eight) hours as needed for moderate pain.    [provider]  meloxicam  (MOBIC ) 7.5 MG tablet Take 1 tablet (7.5 mg total) by mouth daily. 05/22/22   Smoot, Lauraine LABOR, PA-C  methocarbamol  (ROBAXIN ) 500 MG tablet Take 1 tablet (500 mg total) by mouth every 8 (eight) hours as needed. 02/23/21   Magnant, Charles L, PA-C  lisdexamfetamine (VYVANSE) 20 MG capsule Take 20 mg by mouth every morning.  12/26/19  [provider]    Allergies: Patient has no known  allergies.    Review of Systems  HENT:  Positive for congestion.   Respiratory:  Positive for cough.   All other systems reviewed and are negative.   Updated Vital Signs BP 132/85 (BP Location: Right Arm)   Pulse 82   Temp 98.1 F (36.7 C) (Oral)   Resp 14   Ht 6' 4 (1.93 m)   Wt 122.5 kg   SpO2 100%   BMI 32.87 kg/m   Physical Exam Vitals and nursing note reviewed.  Constitutional:      General: He is not in acute distress.    Appearance: Normal appearance.  HENT:     Head: Normocephalic and atraumatic.     Right Ear: Hearing, tympanic membrane, ear canal and external ear normal.     Left Ear: Hearing, tympanic membrane, ear canal and external ear normal.     Nose: Mucosal edema and congestion present.     Mouth/Throat:     Mouth: Mucous membranes are moist.     Pharynx: Oropharynx is clear. Uvula midline. Posterior oropharyngeal erythema and postnasal drip present.  Eyes:     Extraocular Movements: Extraocular movements intact.     Conjunctiva/sclera: Conjunctivae normal.     Pupils: Pupils are equal, round, and reactive to light.  Neck:     Trachea: Trachea and phonation normal.  Cardiovascular:     Rate and Rhythm: Normal rate and regular rhythm.  Pulses: Normal pulses.     Heart sounds: Normal heart sounds, S1 normal and S2 normal. No murmur heard.    No friction rub. No gallop.  Pulmonary:     Effort: Pulmonary effort is normal.     Breath sounds: Normal breath sounds and air entry.  Abdominal:     General: Abdomen is flat. Bowel sounds are normal.     Palpations: Abdomen is soft.  Musculoskeletal:        General: Normal range of motion.     Cervical back: Normal range of motion and neck supple.     Right lower leg: No edema.     Left lower leg: No edema.  Lymphadenopathy:     Cervical: Cervical adenopathy present.     Right cervical: Superficial cervical adenopathy present.     Left cervical: Superficial cervical adenopathy present.  Skin:     General: Skin is warm and dry.     Capillary Refill: Capillary refill takes less than 2 seconds.  Neurological:     General: No focal deficit present.     Mental Status: He is alert and oriented to person, place, and time. Mental status is at baseline.     GCS: GCS eye subscore is 4. GCS verbal subscore is 5. GCS motor subscore is 6.  Psychiatric:        Mood and Affect: Mood normal.     (all labs ordered are listed, but only abnormal results are displayed) Labs Reviewed  RESP PANEL BY RT-PCR (RSV, FLU A&B, COVID)  [id number]    EKG: None  Radiology: DG Chest 1 View Result Date: 07/21/2024 EXAM: 1 VIEW(S) XRAY OF THE CHEST 07/21/2024 06:16:00 PM COMPARISON: None available. CLINICAL HISTORY: cough FINDINGS: LUNGS AND PLEURA: No focal pulmonary opacity. No pleural effusion. No pneumothorax. HEART AND MEDIASTINUM: No acute abnormality of the cardiac and mediastinal silhouettes. BONES AND SOFT TISSUES: No acute osseous abnormality. IMPRESSION: 1. No acute process. Electronically signed by: Lynwood Seip MD 07/21/2024 06:32 PM EST RP Workstation: [id number]     Procedures   Medications Ordered in the ED - No data to display                                  Medical Decision Making Amount and/or Complexity of Data Reviewed Radiology: ordered.  Risk OTC drugs. Prescription drug management.   Physical exam of this patient is reassuring, obtained nasopharyngeal swab to evaluate for viral etiology which is negative.  Consider possible pneumonia given the persistent cough, chest x-ray was obtained which is negative.  Given that the workup is negative for viral etiology specific to COVID/flu/RSV, and there does not appear to be any acute pneumonia, the nasal mucosa are edematous with copious postnasal drip appreciated, believe this is secondary to viral URI, with continued rhinitis.  Will manage his rhinitis with fluticasone nasal spray, and cetirizine and continue to use over-the-counter  cough and cold medications.  Will also prescribe a course of Tessalon to be used for cough management.  He is afebrile, vital signs are remained stable, he is no other concerning findings on his physical exam, nor concerning findings on the workup, will discharge patient with outpatient management as previously discussed.     Final diagnoses:  Viral URI with cough    ED Discharge Orders          Ordered    fluticasone (FLONASE) 50 MCG/ACT nasal spray  Daily        07/21/24 1928    benzonatate (TESSALON) 100 MG capsule  Every 8 hours        07/21/24 1928    cetirizine (ZYRTEC) 10 MG tablet  Daily        07/21/24 1928               Myriam Dorn BROCKS, [place] 07/21/24 2331    Kingsley, Victoria K, DO 07/21/24 2343

## 2024-07-29 ENCOUNTER — Emergency Department (HOSPITAL_COMMUNITY)

## 2024-07-29 ENCOUNTER — Emergency Department (HOSPITAL_COMMUNITY)
Admission: EM | Admit: 2024-07-29 | Discharge: 2024-07-29 | Disposition: A | Discharge status: Home / Self Care | Attending: Emergency Medicine | Admitting: Emergency Medicine

## 2024-07-29 DIAGNOSIS — M25511 Pain in right shoulder: Secondary | ICD-10-CM | POA: Insufficient documentation

## 2024-07-29 DIAGNOSIS — Y92481 Parking lot as the place of occurrence of the external cause: Secondary | ICD-10-CM | POA: Diagnosis not present

## 2024-07-29 MED ORDER — IBUPROFEN 800 MG PO TABS
800.0000 mg | ORAL_TABLET | Freq: Once | ORAL | Status: AC
  Administered 2024-07-29: 800 mg via ORAL
  Filled 2024-07-29 (×2): qty 1

## 2024-07-29 NOTE — Progress Notes (Signed)
 Orthopedic Tech Progress Note Patient Details:  Nathaniel Matthews Jan 21, 2001 [id number]  Ortho Devices Type of Ortho Device: Sling immobilizer Ortho Device/Splint Location: RUE Ortho Device/Splint Interventions: Ordered, Application, AdjustmentRN called requesting an ARM SLING, applied with family at bedside    Post Interventions Patient Tolerated: Well Instructions Provided: Care of device  Delanna LITTIE Pac 07/29/2024, 6:13 PM
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
## 2024-09-02 ENCOUNTER — Emergency Department (HOSPITAL_COMMUNITY)
Admission: EM | Admit: 2024-09-02 | Discharge: 2024-09-02 | Discharge status: Against Medical Advice | Source: Home / Self Care

## 2024-09-02 ENCOUNTER — Ambulatory Visit (HOSPITAL_COMMUNITY): Admission: EM | Admit: 2024-09-02 | Discharge: 2024-09-02 | Discharge status: Against Medical Advice

## 2024-09-02 NOTE — ED Notes (Signed)
Called pt from lobby no answer X 1 

## 2024-09-02 NOTE — ED Notes (Signed)
 Called x 3, no answer.

## 2024-09-02 NOTE — ED Notes (Addendum)
 Called x2, no answer
